# Patient Record
Sex: Female | Born: 1975
Health system: Southern US, Community
[De-identification: ages and names within clinical notes are randomized; demographics above are authoritative.]

## PROBLEM LIST (undated history)

## (undated) ENCOUNTER — Inpatient Hospital Stay: Payer: Self-pay

## (undated) DIAGNOSIS — Z8719 Personal history of other diseases of the digestive system: Secondary | ICD-10-CM

## (undated) DIAGNOSIS — D649 Anemia, unspecified: Secondary | ICD-10-CM

## (undated) DIAGNOSIS — O219 Vomiting of pregnancy, unspecified: Secondary | ICD-10-CM

## (undated) DIAGNOSIS — Z8619 Personal history of other infectious and parasitic diseases: Secondary | ICD-10-CM

## (undated) DIAGNOSIS — F99 Mental disorder, not otherwise specified: Secondary | ICD-10-CM

## (undated) DIAGNOSIS — C801 Malignant (primary) neoplasm, unspecified: Secondary | ICD-10-CM

## (undated) DIAGNOSIS — K219 Gastro-esophageal reflux disease without esophagitis: Secondary | ICD-10-CM

## (undated) HISTORY — PX: GALLBLADDER SURGERY: SHX652

## (undated) HISTORY — DX: Vomiting of pregnancy, unspecified: O21.9

## (undated) HISTORY — DX: Mental disorder, not otherwise specified: F99

## (undated) HISTORY — PX: HERNIA REPAIR: SHX51

## (undated) HISTORY — DX: Gastro-esophageal reflux disease without esophagitis: K21.9

## (undated) HISTORY — PX: OTHER SURGICAL HISTORY: SHX169

---

## 1999-01-02 ENCOUNTER — Emergency Department (HOSPITAL_COMMUNITY): Admission: EM | Admit: 1999-01-02 | Discharge: 1999-01-02 | Payer: Self-pay | Admitting: Ophthalmology

## 2005-09-18 ENCOUNTER — Observation Stay: Payer: Self-pay | Admitting: Obstetrics and Gynecology

## 2008-06-07 ENCOUNTER — Emergency Department (HOSPITAL_COMMUNITY): Admission: EM | Admit: 2008-06-07 | Discharge: 2008-06-07 | Payer: Self-pay | Admitting: Emergency Medicine

## 2008-06-14 ENCOUNTER — Emergency Department: Payer: Self-pay | Admitting: Emergency Medicine

## 2008-10-10 ENCOUNTER — Observation Stay: Payer: Self-pay | Admitting: Obstetrics and Gynecology

## 2008-11-30 ENCOUNTER — Observation Stay: Payer: Self-pay | Admitting: Obstetrics and Gynecology

## 2008-12-01 ENCOUNTER — Observation Stay: Payer: Self-pay

## 2008-12-09 ENCOUNTER — Observation Stay: Payer: Self-pay

## 2008-12-11 ENCOUNTER — Inpatient Hospital Stay (HOSPITAL_COMMUNITY): Admission: AD | Admit: 2008-12-11 | Discharge: 2008-12-13 | Payer: Self-pay | Admitting: Obstetrics and Gynecology

## 2008-12-11 ENCOUNTER — Encounter (INDEPENDENT_AMBULATORY_CARE_PROVIDER_SITE_OTHER): Payer: Self-pay | Admitting: Obstetrics and Gynecology

## 2009-12-14 ENCOUNTER — Emergency Department: Payer: Self-pay | Admitting: Emergency Medicine

## 2010-02-20 ENCOUNTER — Emergency Department: Payer: Self-pay | Admitting: Emergency Medicine

## 2010-05-09 ENCOUNTER — Emergency Department: Payer: Self-pay | Admitting: Emergency Medicine

## 2011-02-18 LAB — CBC
HCT: 25.8 % — ABNORMAL LOW (ref 36.0–46.0)
HCT: 30.3 % — ABNORMAL LOW (ref 36.0–46.0)
Hemoglobin: 8.1 g/dL — ABNORMAL LOW (ref 12.0–15.0)
Hemoglobin: 9.4 g/dL — ABNORMAL LOW (ref 12.0–15.0)
MCHC: 31.1 g/dL (ref 30.0–36.0)
MCHC: 31.4 g/dL (ref 30.0–36.0)
MCV: 63.6 fL — ABNORMAL LOW (ref 78.0–100.0)
MCV: 63.9 fL — ABNORMAL LOW (ref 78.0–100.0)
Platelets: 249 10*3/uL (ref 150–400)
Platelets: 300 10*3/uL (ref 150–400)
RBC: 4.04 MIL/uL (ref 3.87–5.11)
RBC: 4.77 MIL/uL (ref 3.87–5.11)
RDW: 15.2 % (ref 11.5–15.5)
RDW: 15.2 % (ref 11.5–15.5)
WBC: 12.7 10*3/uL — ABNORMAL HIGH (ref 4.0–10.5)
WBC: 7.4 10*3/uL (ref 4.0–10.5)

## 2011-02-18 LAB — RPR: RPR Ser Ql: NONREACTIVE
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
## 2011-06-02 ENCOUNTER — Emergency Department: Payer: Self-pay | Admitting: Emergency Medicine

## 2011-08-01 LAB — POCT I-STAT, CHEM 8
BUN: <3 — ABNORMAL LOW
Calcium, Ion: 1.28
Chloride: 105
Creatinine, Ser: 0.7
Glucose, Bld: 87
HCT: 34 — ABNORMAL LOW
Hemoglobin: 11.6 — ABNORMAL LOW
Potassium: 4.4
Sodium: 137
TCO2: 24

## 2011-08-01 LAB — POCT PREGNANCY, URINE
Preg Test, Ur: NEGATIVE
Preg Test, Ur: POSITIVE

## 2011-08-01 LAB — URINE CULTURE: Colony Count: >=100000

## 2011-08-01 LAB — URINALYSIS, ROUTINE W REFLEX MICROSCOPIC
Glucose, UA: NEGATIVE
Hgb urine dipstick: NEGATIVE
Ketones, ur: 15 — AB
Nitrite: NEGATIVE
Protein, ur: NEGATIVE
Specific Gravity, Urine: 1.029
Urobilinogen, UA: 1
pH: 6

## 2011-08-01 LAB — URINE MICROSCOPIC-ADD ON

## 2013-08-15 ENCOUNTER — Ambulatory Visit: Payer: Self-pay | Admitting: Family

## 2013-10-06 ENCOUNTER — Emergency Department: Payer: Self-pay | Admitting: Internal Medicine

## 2013-10-06 LAB — URINALYSIS, COMPLETE
Bilirubin,UR: NEGATIVE
Blood: NEGATIVE
Glucose,UR: NEGATIVE mg/dL (ref 0–75)
Ketone: NEGATIVE
Nitrite: POSITIVE
Ph: 6 (ref 4.5–8.0)
Protein: NEGATIVE
RBC,UR: <1 /HPF (ref 0–5)
Specific Gravity: 1.017 (ref 1.003–1.030)
Squamous Epithelial: <1
WBC UR: 6 /HPF (ref 0–5)

## 2013-10-08 LAB — URINE CULTURE

## 2013-11-29 ENCOUNTER — Inpatient Hospital Stay: Payer: Self-pay | Admitting: Internal Medicine

## 2013-11-29 LAB — COMPREHENSIVE METABOLIC PANEL
Albumin: 3.4 g/dL (ref 3.4–5.0)
Alkaline Phosphatase: 118 U/L — ABNORMAL HIGH
Anion Gap: 6 — ABNORMAL LOW (ref 7–16)
BUN: 7 mg/dL (ref 7–18)
Bilirubin,Total: 0.3 mg/dL (ref 0.2–1.0)
Calcium, Total: 8.9 mg/dL (ref 8.5–10.1)
Chloride: 108 mmol/L — ABNORMAL HIGH (ref 98–107)
Co2: 23 mmol/L (ref 21–32)
Creatinine: 0.64 mg/dL (ref 0.60–1.30)
EGFR (African American): >60
EGFR (Non-African Amer.): >60
Glucose: 85 mg/dL (ref 65–99)
Osmolality: 271 (ref 275–301)
Potassium: 3.5 mmol/L (ref 3.5–5.1)
SGOT(AST): 24 U/L (ref 15–37)
SGPT (ALT): 13 U/L (ref 12–78)
Sodium: 137 mmol/L (ref 136–145)
Total Protein: 7.9 g/dL (ref 6.4–8.2)

## 2013-11-29 LAB — DIFFERENTIAL
Bands: 1 %
Basophil: 1 %
Lymphocytes: 37 %
Monocytes: 6 %
Segmented Neutrophils: 54 %
Variant Lymphocyte - H1-Rlymph: 1 %

## 2013-11-29 LAB — CBC
HCT: 20.6 % — ABNORMAL LOW (ref 35.0–47.0)
HGB: 5.6 g/dL — ABNORMAL LOW (ref 12.0–16.0)
MCH: 13.5 pg — ABNORMAL LOW (ref 26.0–34.0)
MCHC: 27 g/dL — ABNORMAL LOW (ref 32.0–36.0)
MCV: 50 fL — ABNORMAL LOW (ref 80–100)
Platelet: 378 10*3/uL (ref 150–440)
RBC: 4.11 10*6/uL (ref 3.80–5.20)
RDW: 19.1 % — ABNORMAL HIGH (ref 11.5–14.5)
WBC: 4.8 10*3/uL (ref 3.6–11.0)

## 2013-11-29 LAB — URINALYSIS, COMPLETE
Bacteria: NONE SEEN
Bilirubin,UR: NEGATIVE
Blood: NEGATIVE
Glucose,UR: NEGATIVE mg/dL (ref 0–75)
Ketone: NEGATIVE
Nitrite: NEGATIVE
Ph: 6 (ref 4.5–8.0)
Protein: NEGATIVE
RBC,UR: 5 /HPF (ref 0–5)
Specific Gravity: 1.019 (ref 1.003–1.030)
Squamous Epithelial: 4
WBC UR: 98 /HPF (ref 0–5)

## 2013-11-29 LAB — RETICULOCYTES
Absolute Retic Count: 0.0535 10*6/uL (ref 0.019–0.186)
Reticulocyte: 1.34 % (ref 0.4–3.1)

## 2013-11-29 LAB — LACTATE DEHYDROGENASE: LDH: 245 U/L (ref 81–246)

## 2013-11-29 LAB — FERRITIN: Ferritin (ARMC): 1 ng/mL — ABNORMAL LOW (ref 8–388)

## 2013-11-29 LAB — TROPONIN I: Troponin-I: <0.02 ng/mL

## 2013-11-30 LAB — BASIC METABOLIC PANEL
Anion Gap: 6 — ABNORMAL LOW (ref 7–16)
BUN: 6 mg/dL — ABNORMAL LOW (ref 7–18)
Calcium, Total: 8.6 mg/dL (ref 8.5–10.1)
Chloride: 108 mmol/L — ABNORMAL HIGH (ref 98–107)
Co2: 24 mmol/L (ref 21–32)
Creatinine: 0.8 mg/dL (ref 0.60–1.30)
EGFR (African American): >60
EGFR (Non-African Amer.): >60
Glucose: 93 mg/dL (ref 65–99)
Osmolality: 273 (ref 275–301)
Potassium: 3.9 mmol/L (ref 3.5–5.1)
Sodium: 138 mmol/L (ref 136–145)

## 2013-11-30 LAB — CBC WITH DIFFERENTIAL/PLATELET
Bands: 1 %
Basophil: 1 %
Eosinophil: 1 %
HCT: 25 % — ABNORMAL LOW (ref 35.0–47.0)
HGB: 7.6 g/dL — ABNORMAL LOW (ref 12.0–16.0)
Lymphocytes: 31 %
MCH: 17.4 pg — ABNORMAL LOW (ref 26.0–34.0)
MCHC: 30.3 g/dL — ABNORMAL LOW (ref 32.0–36.0)
MCV: 57 fL — ABNORMAL LOW (ref 80–100)
Monocytes: 4 %
Platelet: 323 10*3/uL (ref 150–440)
RBC: 4.35 10*6/uL (ref 3.80–5.20)
RDW: 31.9 % — ABNORMAL HIGH (ref 11.5–14.5)
Segmented Neutrophils: 62 %
WBC: 4.8 10*3/uL (ref 3.6–11.0)

## 2013-11-30 LAB — IRON AND TIBC
Iron Bind.Cap.(Total): 533 ug/dL — ABNORMAL HIGH (ref 250–450)
Iron Saturation: 2 %
Iron: 9 ug/dL — ABNORMAL LOW (ref 50–170)
Unbound Iron-Bind.Cap.: 524 ug/dL

## 2013-12-01 LAB — BASIC METABOLIC PANEL
Anion Gap: 4 — ABNORMAL LOW (ref 7–16)
BUN: 5 mg/dL — ABNORMAL LOW (ref 7–18)
Calcium, Total: 8.7 mg/dL (ref 8.5–10.1)
Chloride: 108 mmol/L — ABNORMAL HIGH (ref 98–107)
Co2: 26 mmol/L (ref 21–32)
Creatinine: 0.87 mg/dL (ref 0.60–1.30)
EGFR (African American): >60
EGFR (Non-African Amer.): >60
Glucose: 87 mg/dL (ref 65–99)
Osmolality: 272 (ref 275–301)
Potassium: 4.1 mmol/L (ref 3.5–5.1)
Sodium: 138 mmol/L (ref 136–145)

## 2013-12-01 LAB — URINE CULTURE

## 2013-12-01 LAB — CBC WITH DIFFERENTIAL/PLATELET
Basophil: 1 %
Eosinophil: 2 %
HCT: 24.2 % — ABNORMAL LOW (ref 35.0–47.0)
HGB: 7.4 g/dL — ABNORMAL LOW (ref 12.0–16.0)
Lymphocytes: 42 %
MCH: 17.5 pg — ABNORMAL LOW (ref 26.0–34.0)
MCHC: 30.6 g/dL — ABNORMAL LOW (ref 32.0–36.0)
MCV: 57 fL — ABNORMAL LOW (ref 80–100)
Monocytes: 4 %
Myelocyte: 1 %
Platelet: 262 10*3/uL (ref 150–440)
RBC: 4.23 10*6/uL (ref 3.80–5.20)
RDW: 31.5 % — ABNORMAL HIGH (ref 11.5–14.5)
Segmented Neutrophils: 50 %
WBC: 5.2 10*3/uL (ref 3.6–11.0)

## 2013-12-02 LAB — CBC WITH DIFFERENTIAL/PLATELET
Basophil #: 0 10*3/uL (ref 0.0–0.1)
Basophil %: 0.9 %
Eosinophil #: 0.2 10*3/uL (ref 0.0–0.7)
Eosinophil %: 3.6 %
HCT: 25 % — ABNORMAL LOW (ref 35.0–47.0)
HGB: 7.6 g/dL — ABNORMAL LOW (ref 12.0–16.0)
Lymphocyte #: 1.7 10*3/uL (ref 1.0–3.6)
Lymphocyte %: 33.2 %
MCH: 17.4 pg — ABNORMAL LOW (ref 26.0–34.0)
MCHC: 30.5 g/dL — ABNORMAL LOW (ref 32.0–36.0)
MCV: 57 fL — ABNORMAL LOW (ref 80–100)
Monocyte #: 0.4 x10 3/mm (ref 0.2–0.9)
Monocyte %: 6.9 %
Neutrophil #: 2.9 10*3/uL (ref 1.4–6.5)
Neutrophil %: 55.4 %
Platelet: 255 10*3/uL (ref 150–440)
RBC: 4.39 10*6/uL (ref 3.80–5.20)
RDW: 32.8 % — ABNORMAL HIGH (ref 11.5–14.5)
WBC: 5.2 10*3/uL (ref 3.6–11.0)

## 2013-12-05 ENCOUNTER — Emergency Department: Payer: Self-pay | Admitting: Emergency Medicine

## 2013-12-05 LAB — COMPREHENSIVE METABOLIC PANEL
Albumin: 3 g/dL — ABNORMAL LOW (ref 3.4–5.0)
Alkaline Phosphatase: 112 U/L
Anion Gap: 1 — ABNORMAL LOW (ref 7–16)
BUN: 10 mg/dL (ref 7–18)
Bilirubin,Total: 0.2 mg/dL (ref 0.2–1.0)
Calcium, Total: 8.3 mg/dL — ABNORMAL LOW (ref 8.5–10.1)
Chloride: 112 mmol/L — ABNORMAL HIGH (ref 98–107)
Co2: 24 mmol/L (ref 21–32)
Creatinine: 0.67 mg/dL (ref 0.60–1.30)
EGFR (African American): >60
EGFR (Non-African Amer.): >60
Glucose: 65 mg/dL (ref 65–99)
Osmolality: 271 (ref 275–301)
Potassium: 3.8 mmol/L (ref 3.5–5.1)
SGOT(AST): 30 U/L (ref 15–37)
SGPT (ALT): 13 U/L (ref 12–78)
Sodium: 137 mmol/L (ref 136–145)
Total Protein: 7.4 g/dL (ref 6.4–8.2)

## 2013-12-05 LAB — CBC
HCT: 26.8 % — ABNORMAL LOW (ref 35.0–47.0)
HGB: 8.2 g/dL — ABNORMAL LOW (ref 12.0–16.0)
MCH: 18.1 pg — ABNORMAL LOW (ref 26.0–34.0)
MCHC: 30.7 g/dL — ABNORMAL LOW (ref 32.0–36.0)
MCV: 59 fL — ABNORMAL LOW (ref 80–100)
Platelet: 231 10*3/uL (ref 150–440)
RBC: 4.54 10*6/uL (ref 3.80–5.20)
RDW: 33.2 % — ABNORMAL HIGH (ref 11.5–14.5)
WBC: 5.1 10*3/uL (ref 3.6–11.0)

## 2013-12-05 LAB — TROPONIN I: Troponin-I: <0.02 ng/mL

## 2013-12-25 ENCOUNTER — Emergency Department: Payer: Self-pay | Admitting: Emergency Medicine

## 2013-12-25 LAB — COMPREHENSIVE METABOLIC PANEL
Albumin: 3.4 g/dL (ref 3.4–5.0)
Alkaline Phosphatase: 144 U/L — ABNORMAL HIGH
Anion Gap: 6 — ABNORMAL LOW (ref 7–16)
BUN: 10 mg/dL (ref 7–18)
Bilirubin,Total: 0.4 mg/dL (ref 0.2–1.0)
Calcium, Total: 8.7 mg/dL (ref 8.5–10.1)
Chloride: 108 mmol/L — ABNORMAL HIGH (ref 98–107)
Co2: 24 mmol/L (ref 21–32)
Creatinine: 0.77 mg/dL (ref 0.60–1.30)
EGFR (African American): >60
EGFR (Non-African Amer.): >60
Glucose: 70 mg/dL (ref 65–99)
Osmolality: 273 (ref 275–301)
Potassium: 3.6 mmol/L (ref 3.5–5.1)
SGOT(AST): 66 U/L — ABNORMAL HIGH (ref 15–37)
SGPT (ALT): 61 U/L (ref 12–78)
Sodium: 138 mmol/L (ref 136–145)
Total Protein: 8 g/dL (ref 6.4–8.2)

## 2013-12-25 LAB — CBC
HCT: 31.8 % — ABNORMAL LOW (ref 35.0–47.0)
HGB: 9.4 g/dL — ABNORMAL LOW (ref 12.0–16.0)
MCH: 17.8 pg — ABNORMAL LOW (ref 26.0–34.0)
MCHC: 29.6 g/dL — ABNORMAL LOW (ref 32.0–36.0)
MCV: 60 fL — ABNORMAL LOW (ref 80–100)
Platelet: 364 10*3/uL (ref 150–440)
RBC: 5.28 10*6/uL — ABNORMAL HIGH (ref 3.80–5.20)
RDW: 32 % — ABNORMAL HIGH (ref 11.5–14.5)
WBC: 5.7 10*3/uL (ref 3.6–11.0)

## 2013-12-25 LAB — URINALYSIS, COMPLETE
Bacteria: NONE SEEN
Bilirubin,UR: NEGATIVE
Blood: NEGATIVE
Glucose,UR: NEGATIVE mg/dL (ref 0–75)
Ketone: NEGATIVE
Leukocyte Esterase: NEGATIVE
Nitrite: NEGATIVE
Ph: 7 (ref 4.5–8.0)
Protein: NEGATIVE
RBC,UR: 1 /HPF (ref 0–5)
Specific Gravity: 1.02 (ref 1.003–1.030)
Squamous Epithelial: 5
WBC UR: 2 /HPF (ref 0–5)

## 2013-12-25 LAB — LIPASE, BLOOD: Lipase: 269 U/L (ref 73–393)

## 2014-01-05 ENCOUNTER — Ambulatory Visit: Payer: Self-pay | Admitting: Gastroenterology

## 2014-01-08 ENCOUNTER — Emergency Department: Payer: Self-pay | Admitting: Emergency Medicine

## 2014-01-12 ENCOUNTER — Ambulatory Visit: Payer: Self-pay | Admitting: Gastroenterology

## 2014-03-30 DIAGNOSIS — R1013 Epigastric pain: Secondary | ICD-10-CM | POA: Insufficient documentation

## 2014-03-30 DIAGNOSIS — D649 Anemia, unspecified: Secondary | ICD-10-CM | POA: Insufficient documentation

## 2014-04-05 ENCOUNTER — Ambulatory Visit: Payer: Self-pay | Admitting: Gastroenterology

## 2014-04-06 ENCOUNTER — Emergency Department: Payer: Self-pay | Admitting: Emergency Medicine

## 2014-04-06 LAB — CBC WITH DIFFERENTIAL/PLATELET
Basophil #: 0 10*3/uL (ref 0.0–0.1)
Basophil %: 0.8 %
Eosinophil #: 0.1 10*3/uL (ref 0.0–0.7)
Eosinophil %: 2.3 %
HCT: 25.3 % — ABNORMAL LOW (ref 35.0–47.0)
HGB: 7.5 g/dL — ABNORMAL LOW (ref 12.0–16.0)
Lymphocyte #: 1.9 10*3/uL (ref 1.0–3.6)
Lymphocyte %: 39.7 %
MCH: 16.2 pg — ABNORMAL LOW (ref 26.0–34.0)
MCHC: 29.7 g/dL — ABNORMAL LOW (ref 32.0–36.0)
MCV: 55 fL — ABNORMAL LOW (ref 80–100)
Monocyte #: 0.2 x10 3/mm (ref 0.2–0.9)
Monocyte %: 4.4 %
Neutrophil #: 2.5 10*3/uL (ref 1.4–6.5)
Neutrophil %: 52.8 %
Platelet: 418 10*3/uL (ref 150–440)
RBC: 4.64 10*6/uL (ref 3.80–5.20)
RDW: 16.1 % — ABNORMAL HIGH (ref 11.5–14.5)
WBC: 4.7 10*3/uL (ref 3.6–11.0)

## 2014-04-06 LAB — COMPREHENSIVE METABOLIC PANEL
Albumin: 3.5 g/dL (ref 3.4–5.0)
Alkaline Phosphatase: 125 U/L — ABNORMAL HIGH
Anion Gap: 7 (ref 7–16)
BUN: 9 mg/dL (ref 7–18)
Bilirubin,Total: 0.3 mg/dL (ref 0.2–1.0)
Calcium, Total: 8.9 mg/dL (ref 8.5–10.1)
Chloride: 108 mmol/L — ABNORMAL HIGH (ref 98–107)
Co2: 26 mmol/L (ref 21–32)
Creatinine: 0.82 mg/dL (ref 0.60–1.30)
EGFR (African American): >60
EGFR (Non-African Amer.): >60
Glucose: 86 mg/dL (ref 65–99)
Osmolality: 279 (ref 275–301)
Potassium: 3.3 mmol/L — ABNORMAL LOW (ref 3.5–5.1)
SGOT(AST): 27 U/L (ref 15–37)
SGPT (ALT): 11 U/L — ABNORMAL LOW (ref 12–78)
Sodium: 141 mmol/L (ref 136–145)
Total Protein: 8.3 g/dL — ABNORMAL HIGH (ref 6.4–8.2)

## 2014-04-06 LAB — TROPONIN I: Troponin-I: <0.02 ng/mL

## 2014-04-06 LAB — LIPASE, BLOOD: Lipase: 216 U/L (ref 73–393)

## 2014-04-07 LAB — URINALYSIS, COMPLETE
Bilirubin,UR: NEGATIVE
Glucose,UR: NEGATIVE mg/dL (ref 0–75)
Ketone: NEGATIVE
Nitrite: NEGATIVE
Ph: 5 (ref 4.5–8.0)
Protein: 30
RBC,UR: 4 /HPF (ref 0–5)
Specific Gravity: 1.027 (ref 1.003–1.030)
Squamous Epithelial: 12
WBC UR: 30 /HPF (ref 0–5)

## 2014-04-09 LAB — URINE CULTURE

## 2014-06-06 ENCOUNTER — Observation Stay: Payer: Self-pay | Admitting: Internal Medicine

## 2014-06-06 LAB — URINALYSIS, COMPLETE
Bilirubin,UR: NEGATIVE
Glucose,UR: NEGATIVE mg/dL (ref 0–75)
Nitrite: POSITIVE
Ph: 5 (ref 4.5–8.0)
Protein: 30
RBC,UR: 2 /HPF (ref 0–5)
Specific Gravity: 1.025 (ref 1.003–1.030)
Squamous Epithelial: 9
WBC UR: 32 /HPF (ref 0–5)

## 2014-06-06 LAB — CBC WITH DIFFERENTIAL/PLATELET
Eosinophil: 1 %
HCT: 25.6 % — ABNORMAL LOW (ref 35.0–47.0)
HGB: 7.2 g/dL — ABNORMAL LOW (ref 12.0–16.0)
Lymphocytes: 31 %
MCH: 14.4 pg — ABNORMAL LOW (ref 26.0–34.0)
MCHC: 28.2 g/dL — ABNORMAL LOW (ref 32.0–36.0)
MCV: 51 fL — ABNORMAL LOW (ref 80–100)
Monocytes: 5 %
Platelet: 405 10*3/uL (ref 150–440)
RBC: 4.99 10*6/uL (ref 3.80–5.20)
RDW: 19 % — ABNORMAL HIGH (ref 11.5–14.5)
Segmented Neutrophils: 63 %
WBC: 4.4 10*3/uL (ref 3.6–11.0)

## 2014-06-06 LAB — IRON AND TIBC
Iron Bind.Cap.(Total): 484 ug/dL — ABNORMAL HIGH (ref 250–450)
Iron Saturation: 3 %
Iron: 15 ug/dL — ABNORMAL LOW (ref 50–170)
Unbound Iron-Bind.Cap.: 469 ug/dL

## 2014-06-06 LAB — COMPREHENSIVE METABOLIC PANEL
Albumin: 3.5 g/dL (ref 3.4–5.0)
Alkaline Phosphatase: 120 U/L — ABNORMAL HIGH
Anion Gap: 9 (ref 7–16)
BUN: 6 mg/dL — ABNORMAL LOW (ref 7–18)
Bilirubin,Total: 0.5 mg/dL (ref 0.2–1.0)
Calcium, Total: 8.9 mg/dL (ref 8.5–10.1)
Chloride: 108 mmol/L — ABNORMAL HIGH (ref 98–107)
Co2: 24 mmol/L (ref 21–32)
Creatinine: 0.76 mg/dL (ref 0.60–1.30)
EGFR (African American): >60
EGFR (Non-African Amer.): >60
Glucose: 88 mg/dL (ref 65–99)
Osmolality: 278 (ref 275–301)
Potassium: 3.5 mmol/L (ref 3.5–5.1)
SGOT(AST): 29 U/L (ref 15–37)
SGPT (ALT): 14 U/L
Sodium: 141 mmol/L (ref 136–145)
Total Protein: 8.7 g/dL — ABNORMAL HIGH (ref 6.4–8.2)

## 2014-06-06 LAB — LIPASE, BLOOD: Lipase: 151 U/L (ref 73–393)

## 2014-06-06 LAB — HEMOGLOBIN
HGB: 6.1 g/dL — ABNORMAL LOW (ref 12.0–16.0)
HGB: 6.8 g/dL — ABNORMAL LOW (ref 12.0–16.0)

## 2014-06-06 LAB — TROPONIN I: Troponin-I: <0.02 ng/mL

## 2014-06-07 LAB — CBC WITH DIFFERENTIAL/PLATELET
Basophil #: 0 10*3/uL (ref 0.0–0.1)
Basophil %: 0.7 %
Eosinophil #: 0.1 10*3/uL (ref 0.0–0.7)
Eosinophil %: 1.9 %
HCT: 25.4 % — ABNORMAL LOW (ref 35.0–47.0)
HGB: 7.9 g/dL — ABNORMAL LOW (ref 12.0–16.0)
Lymphocyte #: 1.6 10*3/uL (ref 1.0–3.6)
Lymphocyte %: 30.1 %
MCH: 17.7 pg — ABNORMAL LOW (ref 26.0–34.0)
MCHC: 31.1 g/dL — ABNORMAL LOW (ref 32.0–36.0)
MCV: 57 fL — ABNORMAL LOW (ref 80–100)
Monocyte #: 0.4 x10 3/mm (ref 0.2–0.9)
Monocyte %: 7.6 %
Neutrophil #: 3.2 10*3/uL (ref 1.4–6.5)
Neutrophil %: 59.7 %
Platelet: 280 10*3/uL (ref 150–440)
RBC: 4.46 10*6/uL (ref 3.80–5.20)
RDW: 26.9 % — ABNORMAL HIGH (ref 11.5–14.5)
WBC: 5.4 10*3/uL (ref 3.6–11.0)

## 2014-06-07 LAB — BASIC METABOLIC PANEL
Anion Gap: 6 — ABNORMAL LOW (ref 7–16)
BUN: 4 mg/dL — ABNORMAL LOW (ref 7–18)
Calcium, Total: 7.8 mg/dL — ABNORMAL LOW (ref 8.5–10.1)
Chloride: 113 mmol/L — ABNORMAL HIGH (ref 98–107)
Co2: 23 mmol/L (ref 21–32)
Creatinine: 0.77 mg/dL (ref 0.60–1.30)
EGFR (African American): >60
EGFR (Non-African Amer.): >60
Glucose: 90 mg/dL (ref 65–99)
Osmolality: 280 (ref 275–301)
Potassium: 3.6 mmol/L (ref 3.5–5.1)
Sodium: 142 mmol/L (ref 136–145)

## 2014-06-08 LAB — URINE CULTURE

## 2014-08-10 ENCOUNTER — Ambulatory Visit: Payer: Self-pay

## 2014-08-10 LAB — IRON AND TIBC
Iron Bind.Cap.(Total): 505 ug/dL — ABNORMAL HIGH (ref 250–450)
Iron Saturation: 8 %
Iron: 41 ug/dL — ABNORMAL LOW (ref 50–170)
Unbound Iron-Bind.Cap.: 464 ug/dL

## 2014-08-10 LAB — COMPREHENSIVE METABOLIC PANEL
Albumin: 3.7 g/dL (ref 3.4–5.0)
Alkaline Phosphatase: 140 U/L — ABNORMAL HIGH
Anion Gap: 7 (ref 7–16)
BUN: 6 mg/dL — ABNORMAL LOW (ref 7–18)
Bilirubin,Total: 0.6 mg/dL (ref 0.2–1.0)
Calcium, Total: 9 mg/dL (ref 8.5–10.1)
Chloride: 105 mmol/L (ref 98–107)
Co2: 27 mmol/L (ref 21–32)
Creatinine: 0.88 mg/dL (ref 0.60–1.30)
EGFR (African American): >60
EGFR (Non-African Amer.): >60
Glucose: 79 mg/dL (ref 65–99)
Osmolality: 274 (ref 275–301)
Potassium: 3.7 mmol/L (ref 3.5–5.1)
SGOT(AST): 28 U/L (ref 15–37)
SGPT (ALT): 19 U/L
Sodium: 139 mmol/L (ref 136–145)
Total Protein: 8.8 g/dL — ABNORMAL HIGH (ref 6.4–8.2)

## 2014-08-10 LAB — CBC CANCER CENTER
Basophil #: 0.1 x10 3/mm (ref 0.0–0.1)
Basophil %: 0.8 %
Eosinophil #: 0.1 x10 3/mm (ref 0.0–0.7)
Eosinophil %: 1.1 %
HCT: 31 % — ABNORMAL LOW (ref 35.0–47.0)
HGB: 9 g/dL — ABNORMAL LOW (ref 12.0–16.0)
Lymphocyte #: 1.3 x10 3/mm (ref 1.0–3.6)
Lymphocyte %: 18.3 %
MCH: 17 pg — ABNORMAL LOW (ref 26.0–34.0)
MCHC: 29 g/dL — ABNORMAL LOW (ref 32.0–36.0)
MCV: 59 fL — ABNORMAL LOW (ref 80–100)
Monocyte #: 0.5 x10 3/mm (ref 0.2–0.9)
Monocyte %: 6.9 %
Neutrophil #: 5.1 x10 3/mm (ref 1.4–6.5)
Neutrophil %: 72.9 %
Platelet: 384 x10 3/mm (ref 150–440)
RBC: 5.31 10*6/uL — ABNORMAL HIGH (ref 3.80–5.20)
RDW: 22.9 % — ABNORMAL HIGH (ref 11.5–14.5)
WBC: 7 x10 3/mm (ref 3.6–11.0)

## 2014-08-10 LAB — FERRITIN: Ferritin (ARMC): 5 ng/mL — ABNORMAL LOW (ref 8–388)

## 2014-08-15 ENCOUNTER — Ambulatory Visit: Payer: Self-pay | Admitting: Gastroenterology

## 2014-08-18 LAB — IRON AND TIBC
Iron Bind.Cap.(Total): 350 ug/dL (ref 250–450)
Iron Saturation: 18 %
Iron: 63 ug/dL (ref 50–170)
Unbound Iron-Bind.Cap.: 287 ug/dL

## 2014-09-01 DIAGNOSIS — K449 Diaphragmatic hernia without obstruction or gangrene: Secondary | ICD-10-CM | POA: Insufficient documentation

## 2014-09-01 DIAGNOSIS — R0789 Other chest pain: Secondary | ICD-10-CM | POA: Insufficient documentation

## 2014-09-01 DIAGNOSIS — J45909 Unspecified asthma, uncomplicated: Secondary | ICD-10-CM | POA: Insufficient documentation

## 2014-09-01 DIAGNOSIS — F319 Bipolar disorder, unspecified: Secondary | ICD-10-CM | POA: Insufficient documentation

## 2014-09-01 DIAGNOSIS — K219 Gastro-esophageal reflux disease without esophagitis: Secondary | ICD-10-CM | POA: Insufficient documentation

## 2014-09-01 DIAGNOSIS — D509 Iron deficiency anemia, unspecified: Secondary | ICD-10-CM | POA: Insufficient documentation

## 2014-09-01 DIAGNOSIS — R519 Headache, unspecified: Secondary | ICD-10-CM | POA: Insufficient documentation

## 2014-09-03 ENCOUNTER — Ambulatory Visit: Payer: Self-pay | Admitting: Hematology and Oncology

## 2014-09-27 ENCOUNTER — Ambulatory Visit: Payer: Self-pay | Admitting: Obstetrics and Gynecology

## 2014-09-27 LAB — CBC
HCT: 37.5 % (ref 35.0–47.0)
HGB: 11.4 g/dL — ABNORMAL LOW (ref 12.0–16.0)
MCH: 19.5 pg — ABNORMAL LOW (ref 26.0–34.0)
MCHC: 30.5 g/dL — ABNORMAL LOW (ref 32.0–36.0)
MCV: 64 fL — ABNORMAL LOW (ref 80–100)
Platelet: 318 10*3/uL (ref 150–440)
RBC: 5.87 10*6/uL — ABNORMAL HIGH (ref 3.80–5.20)
RDW: 20.3 % — ABNORMAL HIGH (ref 11.5–14.5)
WBC: 4 10*3/uL (ref 3.6–11.0)

## 2014-10-02 ENCOUNTER — Ambulatory Visit: Payer: Self-pay | Admitting: Obstetrics and Gynecology

## 2014-11-03 NOTE — L&D Delivery Note (Signed)
Delivery Note At 7:31 AM a viable preterm female infant was delivered via Vaginal, Spontaneous Delivery (Presentation: Right Occiput Anterior).  APGAR: 8/9 weight   pending  Placenta status: Intact, Spontaneous.  Cord:  with the following complications: terminal bradycardia    CTSP as fetal head was on perineum and baby's heart rate was in the 90s. Baby was born after 1-2 pushes over an intact perineum. Cry with tactile stimulation. Cord was clamped and cut after a 1-2 minute delay while baby lie on mother's chest/abdomen.  Anesthesia: None  Episiotomy: None Lacerations: None Suture Repair: NA Est. Blood Loss (mL): 350  Mom to postpartum.  Baby to warmer.  Zoe Ramos 09/29/2015, 7:50 AM

## 2014-11-15 DIAGNOSIS — Z931 Gastrostomy status: Secondary | ICD-10-CM | POA: Insufficient documentation

## 2014-12-06 ENCOUNTER — Emergency Department: Payer: Self-pay | Admitting: Emergency Medicine

## 2014-12-06 LAB — BASIC METABOLIC PANEL
Anion Gap: 6 — ABNORMAL LOW (ref 7–16)
BUN: 5 mg/dL — ABNORMAL LOW (ref 7–18)
Calcium, Total: 8.8 mg/dL (ref 8.5–10.1)
Chloride: 106 mmol/L (ref 98–107)
Co2: 27 mmol/L (ref 21–32)
Creatinine: 0.93 mg/dL (ref 0.60–1.30)
EGFR (African American): >60
EGFR (Non-African Amer.): >60
Glucose: 86 mg/dL (ref 65–99)
Osmolality: 274 (ref 275–301)
Potassium: 3.5 mmol/L (ref 3.5–5.1)
Sodium: 139 mmol/L (ref 136–145)

## 2014-12-06 LAB — CBC WITH DIFFERENTIAL/PLATELET
Basophil #: 0.1 10*3/uL (ref 0.0–0.1)
Basophil %: 0.7 %
Eosinophil #: 0.2 10*3/uL (ref 0.0–0.7)
Eosinophil %: 2.1 %
HCT: 35.7 % (ref 35.0–47.0)
HGB: 11.3 g/dL — ABNORMAL LOW (ref 12.0–16.0)
Lymphocyte #: 1.9 10*3/uL (ref 1.0–3.6)
Lymphocyte %: 20.5 %
MCH: 19.8 pg — ABNORMAL LOW (ref 26.0–34.0)
MCHC: 31.7 g/dL — ABNORMAL LOW (ref 32.0–36.0)
MCV: 63 fL — ABNORMAL LOW (ref 80–100)
Monocyte #: 0.9 x10 3/mm (ref 0.2–0.9)
Monocyte %: 9.8 %
Neutrophil #: 6.1 10*3/uL (ref 1.4–6.5)
Neutrophil %: 66.9 %
Platelet: 420 10*3/uL (ref 150–440)
RBC: 5.72 10*6/uL — ABNORMAL HIGH (ref 3.80–5.20)
RDW: 14.8 % — ABNORMAL HIGH (ref 11.5–14.5)
WBC: 9.2 10*3/uL (ref 3.6–11.0)

## 2014-12-21 ENCOUNTER — Ambulatory Visit: Payer: Self-pay | Admitting: Hematology and Oncology

## 2015-02-24 NOTE — Discharge Summary (Signed)
PATIENT NAME:  Zoe Ramos, SPOONEMORE MR#:  [id number] DATE OF BIRTH:  1976/03/01  DATE OF ADMISSION:  11/29/2013 DATE OF DISCHARGE:  12/02/2013  ADDENDUM to previously dictated discharge summary.    Please note, the patient left AGAINST MEDICAL ADVICE and I strongly recommend she follows up with her primary care physician for further workup for her hiatal hernia and anemia.   ____________________________ Bryce Cheever S. Manuella Ghazi, MD vss:jm D: 12/03/2013 11:09:00 ET T: 12/03/2013 13:55:22 ET JOB#: [id number]  cc: Cletis Athens, MD Lollie Sails, MD Rodena Goldmann III, MD Brylynn Hanssen S. Manuella Ghazi, MD, <Dictator>    Lucina Mellow [facility] MD ELECTRONICALLY SIGNED 12/05/2013 13:11

## 2015-02-24 NOTE — Discharge Summary (Signed)
PATIENT NAME:  Zoe Ramos, Zoe Ramos MR#:  [id number] DATE OF BIRTH:  [birth year]  DATE OF ADMISSION:  06/06/2014 DATE OF DISCHARGE:  06/07/2014  DISCHARGE DIAGNOSES:  1.  Iron-deficiency anemia.  2.  Hiatal hernia.  3.  Chronic gastrointestinal blood loss.  4.  Tobacco abuse.   DISCHARGE MEDICATIONS:  1.  Depo-Provera 1 mg intramuscular every 3 months.  2.  Haldol 1 mL intramuscular once a month.  3.  Linzess 1 tablet oral once a day.  4.  Ferrous sulfate 325 mg oral 2 times a day with meals.  5.  Ciprofloxacin 5 mg oral 2 times a day for 3 days. 6.  Protonix 40 mg oral 2 times a day.   IMAGING STUDIES: Include abdominal x-ray which showed nothing acute, had a large hiatal hernia.   ADMITTING HISTORY AND PHYSICAL: Please see detailed H and P dictated previously. In brief, a 39 year old female patient was admitted to the hospitalist service after she presented with some nausea, lower abdominal pain, had UTI but also had incidental finding of significant anemia. The patient was transfused 2 units of blood. She has had prior EGD and colonoscopies, had large hiatal hernia, was supposed to follow up with surgery for surgery, but she was noncompliant with the followups. Now that she does not have any further bleeding, the patient's symptoms have resolved, her hemoglobin is stable, the patient will be discharged back home to follow up with Dr. Pat Patrick of surgery for her hiatal hernia. The patient's iron has been increased from once a day to twice a day. For her UTI, she was treated with IV antibiotics in the hospital and will be on ciprofloxacin for 3 more days after discharge.   PHYSICAL EXAMINATION: Prior to discharge: LUNGS: Sound clear.  HEART: Sounds are S1, S2.  ABDOMEN: Soft, nontender. GENITOURINARY: No further bleeding noticed.   DISCHARGE INSTRUCTIONS: Regular diet. Activity as tolerated. Follow up with Dr. Pat Patrick and primary care physician in a week. Return to the Emergency Room if there is any  blood or worsening symptoms.    ____________________________ Leia Alf Zakya Halabi, MD srs:tm D: 06/08/2014 12:51:32 ET T: 06/08/2014 15:25:05 ET JOB#: [id number]  cc: Alveta Heimlich R. Darvin Neighbours, MD, <Dictator> Rodena Goldmann III, MD  Neita Carp MD ELECTRONICALLY SIGNED 06/19/2014 14:10

## 2015-02-24 NOTE — H&P (Signed)
PATIENT NAME:  Zoe Ramos, MASK MR#:  [id number] DATE OF BIRTH:  Oct 17, 1976  DATE OF ADMISSION:  10/02/2014  PREOPERATIVE DIAGNOSES: 1.  Menorrhagia to anemia.  2.  Status post blood transfusion 06/05/2014.  3.  Chronic pelvic pain/severe dysmenorrhea.  HISTORY OF PRESENT ILLNESS: The patient is a 39 year old African American female, para 4-1-1-5, on Depo-Provera for contraception and cycle regulation, with long history of dysmenorrhea and perimenstrual low backache, with significant uncontrolled abnormal uterine bleeding that prompted a transfusion on 06/05/2014, presents for surgical evaluation. The patient had preoperative ultrasound which was normal. No uterine pathology was identified.   PAST MEDICAL HISTORY:  1.  Tobacco abuse.  2.  Abnormal uterine bleeding.  3.  Severe dysmenorrhea.  4.  Perimenstrual low back pain.  5.  Bipolar disorder, depressed, mild. 6.  GERD.  7.  Asthma.  8.  Headache.  9.  Anemia.   PAST SURGICAL HISTORY: 1.  Laparoscopic cholecystectomy.  2.  Hernia repair in 1997.   PAST OB HISTORY: Para 4-1-0-5, all spontaneous vaginal deliveries, with largest baby 7 pounds 3 ounces.   FAMILY HISTORY: Mother had breast cancer. No history of colon or ovary cancer. Diabetes mellitus notable in father and sister.   SOCIAL HISTORY: The patient does not drink alcohol. She does smoke 1 pack of cigarettes a day.   DRUG ALLERGIES: None.   CURRENT MEDICATIONS: Haldol, Depo-Provera.  REVIEW OF SYSTEMS: The patient denies any respiratory symptoms. She does have chronic pelvic pain and abnormal uterine bleeding. Bowel and bladder function are normal.   PHYSICAL EXAMINATION: VITAL SIGNS: Weight 193.5 pounds, height 67.6 inches, body mass index 29.77. Blood pressure 107/74, heart rate 83. GENERAL: The patient is a pleasant African American female in no acute distress. She is alert and oriented.  SKIN: Without rash. OROPHARYNX: Clear.  NECK: Supple without thyromegaly or  adenopathy.  LUNGS: Clear.  HEART: Regular rate and rhythm without murmur.  ABDOMEN: Soft and nontender. No organomegaly. Small umbilical hernia is noted. There is a well-healed 10 cm transverse incision in the upper abdomen.  PELVIC: External genitalia normal. BUS normal. Vagina with good estrogen effect. Cervix without lesions. The uterus is midplane, normal and of appropriate size and shape. It is tender 1/4. There are no adnexal masses.  EXTREMITIES: Without clubbing, cyanosis, or edema.  MUSCULOSKELETAL: Normal.   IMPRESSION: 1.  Chronic pelvic pain/dysmenorrhea.  2.  History of abnormal uterine bleeding with need for transfusion.  3.  Normal pelvic ultrasound.   PLAN:  1.  Laparoscopy with peritoneal biopsies.  2.  Hysteroscopy with dilation and curettage.   PREOPERATIVE CONSENT: The patient is to undergo laparoscopy with peritoneal biopsies for evaluation of chronic pelvic pain and severe dysmenorrhea. She also is to undergo hysteroscopy with dilation and curettage because of her history of abnormal uterine bleeding and anemia, which has required transfusion in the past. The patient is understanding of the planned procedures and is aware of and is accepting of all surgical risks, which include but are not limited to bleeding, infection, pelvic organ injury with need for repair, uterine perforation, anesthesia risks, etc. All questions are answered. Informed consent is given. The patient is ready and willing to proceed with surgery as scheduled.   ____________________________ Alanda Slim Bodi Palmeri, MD mad:sb D: 10/02/2014 07:54:00 ET T: 10/02/2014 08:19:44 ET JOB#: [id number]  cc: Hassell Done A. Debany Vantol, MD, <Dictator>

## 2015-02-24 NOTE — Consult Note (Signed)
PATIENT NAME:  Zoe Ramos, Zoe Ramos MR#:  [id number] DATE OF BIRTH:  1976/10/16  DATE OF CONSULTATION:  11/30/2013  CONSULTING PHYSICIAN:  Lollie Sails, MD  REASON FOR CONSULTATION: Anemia.   HISTORY OF PRESENT ILLNESS: [name] is a 39 year old African American female who was admitted to the hospital yesterday. She states that she had seen her primary physician, Dr. Lavera Guise, several days ago, had some blood drawn at that time and was called that she had a very low hemoglobin and was told to go to the Emergency Room. She has been having some problems with feeling dizzy and weak. She states that she does have a history of anemia and has been anemic in the past, although she is uncertain of the etiology. She has had no luminal evaluations or other GI evaluation. She has not had a period for about a year due to being on Depo. She states she has been having some nausea and dizziness as well as headache, but no abdominal pain and no emesis. She states that she will rarely have emesis; that depends on what she eats. She has been having some problems with constipation, a bowel movement about every 3 to 4 days; however, this is not a change for her. She has not seen any black stools. She denies any previous history of peptic ulcer disease. She states she does take Tylenol and denies the use of any aspirins or NSAIDs.   GASTROINTESTINAL FAMILY HISTORY: Negative for colorectal cancer, liver disease, or ulcers.   PAST MEDICAL HISTORY: She has a history of schizophrenia/bipolar/depression. She has a history of anemia, uncertain etiology. She has a history of cholecystectomy as well as an umbilical herniorrhaphy.   ALLERGIES: No known drug allergies.   OUTPATIENT MEDICATIONS: Include Depo-Provera. She takes Haldol injectable 5 mg once a month. She also takes Prilosec 20 mg once a day. I am uncertain as to how long she has been taking Prilosec.   SOCIAL HISTORY: She does not use alcohol. She does not use illicit  drugs. She does, however, smoke cigarettes, about a pack of cigarettes daily.   REVIEW OF SYSTEMS: The patient has been eating ice for at least a year. She has not been eating cornstarch.   PHYSICAL EXAMINATION: VITAL SIGNS: Temperature 98.5, pulse 71, respirations 18, blood pressure 100/66.  GENERAL: She is a well-appearing 39 year old African American female in no acute distress.  HEENT: Normocephalic, atraumatic. Eyes are anicteric. Nose: Septum midline. No lesions. Oropharynx: No lesions.  NECK: Supple. No JVD. No lymphadenopathy. No thyromegaly.  HEART: Regular rate and rhythm without rub or gallop.  LUNGS: Bilaterally clear to auscultation.  ABDOMEN: Soft, nontender, nondistended. Bowel sounds positive, normoactive.  RECTAL: Anorectal examination is Hemoccult-negative. Of note, she was also Hemoccult-negative on admission in the ER.  EXTREMITIES: No clubbing, cyanosis, or edema.  NEUROLOGICAL: Cranial nerves II through XII grossly intact. Muscle strength bilaterally equal and symmetric, 5/5. DTRs bilaterally equal and symmetric.   LABORATORY AND DIAGNOSTIC DATA: Include the following: On admission to the hospital, she had an iron of 9. BUN 7, creatinine 0.64, sodium 137, potassium 3.5, chloride 108, bicarbonate 23. TIBC elevated at 533, UIBC normal at 524, iron saturation 2, ferritin 1. Hepatic profile was normal, with the exception of a slightly elevated alkaline phosphatase at 118. Troponin I x 1 was normal. Hemogram on admission to the hospital showed a white count of 4.8, hemoglobin and hematocrit 5.6 and 20.6, platelet count of 378, MCV 50. She has been transfused 1 unit  of blood since admission. Repeat hemogram early this morning showed a hemoglobin of 7.6. Of note also, she has a normal BUN on check and recheck. She had an ECG that was normal sinus rhythm. She had a urinalysis that was negative for blood, actually 5 per high-power field red blood cells.   ASSESSMENT:  1.   Anemia/severely microcytic/iron deficiency. This could be a multifactorial presentation. Agree with GI evaluation, although the patient is currently heme-negative and denies any GI symptoms or black stools. Will proceed with EGD tomorrow and once the stomach is cleared to know that there are no atypical lesions, we will proceed with colonoscopy the following day following bowel prep.  2.  Would further recommend hematology evaluation. I have ordered a hemoglobin electrophoresis today. She has no family history of sickle cell anemia.   I have discussed the risks, benefits, and complications of an EGD and colonoscopy to include, but not limited to bleeding, infection, perforation, and the risk of sedation, and she wishes to proceed. Further recommendations to follow. Continue transfusion as you are. Continue single daily dose PPI as you are.   ____________________________ Lollie Sails, MD mus:jcm D: 11/30/2013 19:14:41 ET T: 11/30/2013 19:28:54 ET JOB#: [id number]  cc: Lollie Sails, MD, <Dictator> Cletis Athens, MD Lollie Sails MD ELECTRONICALLY SIGNED 12/13/2013 14:35

## 2015-02-24 NOTE — H&P (Signed)
PATIENT NAME:  Zoe Ramos, LIGUORI MR#:  [id number] DATE OF BIRTH:  September 28, 1976  DATE OF ADMISSION:  06/06/2014  DATE OF BIRTH: April 19, 1976.   REFERRING PHYSICIAN: Dr. Jasmine December.   PRIMARY CARE PHYSICIAN: None.   CHIEF COMPLAINT: Vomiting and nausea.   HISTORY OF PRESENTING ILLNESS: A 39 year old female with history of chronic anemia and bipolar disorder, for the last 2 to 3 days had complaint of vomiting, pain in the abdomen and chest. The pain was 10 out of 10 on and off. Also had some headache and she had been vomiting frequently. She did not notice any blood in her vomitus and did not have any other complaint of shortness of breath, fever, chills or any urinary symptoms. In the ER, she was given some IV fluids because of looking dehydrated clinically and given nausea medications for symptomatic management. As her nausea and vomiting resolved as well as her abdominal pain also became much better, abdominal  x-ray was done which showed large hiatal hernia. No focal abnormalities and so she was given to hospitalist team for further management. After  IV fluids day, check her hemoglobin level which was reported to be 6.2 with dilution, so we decided to give her some blood transfusion and given to hospitalist team. When I reviewed the records later on because this patient was in the Emergency Room during the down time when computer system was not working. Later on when the system restarted, found to have urinary tract infection, though the patient was denying symptoms. Maybe that was the cause of her presentation and she developed anemia due to dilution.   REVIEW OF SYSTEMS: CONSTITUTIONAL: Negative for fever, fatigue, weakness, pain or weight loss. EYES: No blurry or double vision, discharge or redness.  EARS, NOSE, THROAT: No tinnitus, ear pain or hearing loss.  RESPIRATORY: No cough, wheezing, hemoptysis, or shortness of breath.  CARDIOVASCULAR: No chest pain, orthopnea, edema, arrhythmia, palpitations.   GASTROINTESTINAL: The patient had nausea, vomiting, and abdominal pain. No diarrhea. No blood in the stool or vomitus.  GENITOURINARY: No dysuria, hematuria, or increased frequency.  ENDOCRINE: No heat or cold intolerance. No excessive sweating.  SKIN: No acne, rashes, or lesions.  MUSCULOSKELETAL: No pain or swelling in the joints.  NEUROLOGICAL: No numbness, weakness, tremor.  PSYCHIATRIC: No anxiety, insomnia, bipolar disorder.   PAST MEDICAL HISTORY: Schizophrenia and bipolar disorder.   PAST SURGICAL HISTORY: Hernia repair surgery for umbilical hernia and gallbladder surgery and patient had large hiatal hernia.   SOCIAL HISTORY: She is a smoker with 1 pack of cigarettes daily. No alcohol. No illegal drug use.   FAMILY HISTORY: Her mother had breast cancer. Grandfather had pancreatic cancer. Father had prostate cancer and diabetes and sister has diabetes.   HOME MEDICATIONS:  1. Haldol 1 mL intramuscularly once a month.  2. Ferrous sulfate 325 mg oral once a day.  3. Depo-Provera 1 mL intramuscular every 3 months.   PHYSICAL EXAMINATION:  VITAL SIGNS: Temperature 98.1, pulse 67, respirations 18,  blood pressure 112/73 and pulse oximetry is 100% on room air.  GENERAL: The patient is fully alert, oriented to time, place, and person. Does not appear in any acute distress.  HEAD AND NECK: Head and neck atraumatic. Conjunctivae pink. Oral mucosa moist. Neck supple. No JVD.  RESPIRATORY: Bilateral equal and clear air entry.  CARDIOVASCULAR: S1, S2 present, regular. No murmur.  ABDOMEN: Soft, nontender. Bowel sounds present. No organomegaly.  SKIN: No rashes.  LEGS: No edema.  NEUROLOGICAL: Power 5/5 follows commands.  No gross abnormality.  JOINTS: No swelling or tenderness.  PSYCHIATRIC: Does not appear in any acute psychiatric illness at this time.   IMPORTANT LABORATORY RESULTS: Glucose 88, creatinine 0.76, sodium 141, potassium 3.5, chloride 108, and CO2 24, calcium 8.9.  Total  protein 8.7, bilirubin 0.5, alkaline phosphate 120, SGOT 29, SGPT 14.   Troponin less than 0.02.   WBC 4.4, hemoglobin 7.2, which dropped to 6.1, platelet count is 405,000, and MCV is 51. Urinalysis is positive 32 WBC and 1+ leukocyte esterase with positive nitrite. Abdomen three-way showed large hiatal hernia. No other focal abnormalities noted.   ASSESSMENT AND PLAN: A 39 year old female came with complaint of nausea and vomiting for the last 2 to 3 days with some abdominal epigastric pain and found having hiatal hernia and the complaint resolved after symptomatic management but also had dehydration and given IV fluid, which resulted in dilutional anemia and hemoglobin drop from 7 to 6 and  requiring blood transfusions.   1. Anemia, most likely it is anemia of iron deficiency. We will give blood transfusion and check iron studies. Her MCV is significantly low. If there is any questions on the laboratory results, then she might need to follow with hematologist in the office for this issue as she said that she had anemia in the past and required blood transfusion also. Denies any heavy menstruation as she is on IM contraceptives. Denies any blood loss or any injuries. We will get stool guaiac studies also.  2. Urinary tract infection. Will give Rocephin IV and send urine cultures.  3. Nausea and vomiting. Most likely this was due to urinary tract infection with a symptomatic management with Zofran it resolved.  4. Large hiatal hernia. This is known to patient and this will be managed by her primary care physician.  5. Smoking. Smoking cessation counseling is done for 4 minutes and offered her a nicotine patch, but she does not want it and she will be fine without it.   TOTAL TIME SPENT ON THIS ADMISSION: 50 minutes.    ____________________________ Ceasar Lund Anselm Jungling, MD vgv:jh D: 06/06/2014 23:03:00 ET T: 06/06/2014 23:34:25 ET JOB#: [id number]  cc: Ceasar Lund. Anselm Jungling, MD,  <Dictator> Vaughan Basta MD ELECTRONICALLY SIGNED 06/12/2014 8:17

## 2015-02-24 NOTE — Discharge Summary (Signed)
PATIENT NAME:  Zoe Ramos, Zoe Ramos MR#:  [id number] DATE OF BIRTH:  [birth year]  DATE OF ADMISSION:  11/29/2013 DATE OF DISCHARGE:  12/02/2013  DISCHARGE DIAGNOSES:   1. Severe anemia likely due to iron deficiency, status post 2 units of packed red blood cell transfusion with appropriate response with EGD showing large hiatal hernia and CT confirming same.   2.  Urinary tract infection.  3.  Hypokalemia, presently resolved. 4.  Tobacco abuse, counseled.    SECONDARY DIAGNOSES:   1.  Schizophrenia.   CONSULTATION:   1.  GI, Dr. Donnella Sham.  2.  Surgery, Dr. Pat Patrick.  PROCEDURE AND RADIOLOGY:  EGD by Dr. Donnella Sham on the 29th of January showed normal esophagus.  Large hiatal hernia.  CT scan of the chest, abdomen and pelvis with contrast on the 29th of January showed a large hiatal hernia with approximately 50% of the stomach located in the chest.  Scattered tiny pulmonary nodules.  UA on admission showed 98 WBCs, 2+ leukocyte esterase, no bacteria.  Urine culture was contaminated.    Vitamin B12 level was within normal limits.    HISTORY AND SHORT HOSPITAL COURSE:  The patient is a 39 year old female with above mentioned medical problems was admitted with severe anemia with last hemoglobin of 5.6 and recieved 2 PRBC transfusion which increased it to 7.4 to 7.6. Please see Dr. Marshia Ly history and physical for further details.    GI consultation was obtained with Dr. Donnella Sham who recommended EGD and was performed showing large hiatal hernia for which surgical consultation was recommended which was obtained with Dr. Pat Patrick who recommended furthur workup and likely outpatient surgery if need but patient didn't stay for furthur work up and left AMA .    Please note, the patient left AGAINST MEDICAL ADVICE and I strongly recommend she follows up with her primary care physician for further workup for her hiatal hernia and anemia.   ____________________________ Stanely Sexson S. Manuella Ghazi, MD vss:dp D: 12/03/2013  11:08:00 ET T: 12/03/2013 14:03:12 ET JOB#: [id number]  cc: Gitty Osterlund S. Manuella Ghazi, MD, <Dictator> cc: Cletis Athens, MD Lollie Sails, MD Rodena Goldmann III, MD Deniesha Stenglein S. Manuella Ghazi, MD, <Dictator> Lucina Mellow [facility] MD ELECTRONICALLY SIGNED 12/05/2013 13:11

## 2015-02-24 NOTE — Consult Note (Signed)
PATIENT NAME:  Zoe Ramos, Zoe Ramos MR#:  [id number] DATE OF BIRTH:  1976/05/12  DATE OF CONSULTATION:  12/02/2013  CONSULTING PHYSICIAN:  Rodena Goldmann III, MD PRIMARY CARE PHYSICIAN: Dr. Lavera Guise.  ADMITTING PHYSICIAN: [facility].   CHIEF COMPLAINT: Dizziness, lightheadedness, profound anemia.   HISTORY OF PRESENT ILLNESS: Zoe Ramos is a 39 year old woman with multiple medical problems and multiple psychiatric problems. Zoe Ramos was admitted to the hospital with severe anemia, hemoglobin of 5.6. Zoe Ramos has been having problems with anemia for many years  but has never had such a severe problem as identified currently.   Zoe Ramos has a history of a previous umbilical hernia repair, previous cholecystectomy. Zoe Ramos has a  psychiatric history of schizophrenia and depression. Zoe Ramos is followed by Dr. Cletis Athens.   ALLERGIES: Zoe Ramos has no medical drug allergies.   MEDICATIONS: Depo-Provera injections, Haldol, and Prilosec.   SOCIAL HISTORY: Zoe Ramos is a cigarette smoker and drinks alcohol occasionally.   REVIEW OF SYSTEMS: Otherwise unremarkable. Zoe Ramos does not have any colon cancer history.   FAMILY HISTORY: Noncontributory.   Zoe Ramos was transfused 2 units of blood and underwent an upper GI evaluation with endoscopy. The procedure revealed what appeared to be a large hiatal hernia with possible intrathoracic stomach.   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   Contrast did appear to flow through without difficulty. No bleeding site could be identified on CT or on endoscopy. The surgical service was consulted.   PHYSICAL EXAMINATION:  GENERAL: An alert, pleasant woman, cooperative in no significant distress.  VITAL SIGNS: Her blood pressure is 120/70, heart rate 80 and regular. Zoe Ramos is afebrile.  HEENT: No scleral icterus. No pupillary abnormalities. No facial deformities.  NECK: Supple, nontender with midline trachea. No adenopathy.  CHEST: Clear with no  adventitious sounds. Zoe Ramos has normal pulmonary excursion.  CARDIAC: No murmurs or gallops to my ear and Zoe Ramos seems to be in normal sinus rhythm.  ABDOMEN: Benign. No significant abdominal tenderness, no rebound, no guarding. No hernias noted.  EXTREMITIES: Full range of motion. No deformities.  PSYCHIATRIC: Normal orientation and reasonable affect.   I independently reviewed the CT scan. 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 There  does not appear to be evidence of torsion or volvulus. I would recommend an upper GI series to demonstrate the anatomy should surgery be a possible consideration.   Zoe Ramos does not appear particularly interested in surgery at the present time. I talked with her about surgical choices and Zoe Ramos was hesitant to consider that option at the present time, saying that Zoe Ramos really does not have any symptoms other than the anemia.   I would interested in seeing what a colonoscopy demonstrated to be certain it was not some other source of her ongoing blood loss since the site of blood loss has not been definitively identified.   We will follow her in the hospital if necessary and would recommend followup as an outpatient.   Zoe Ramos does not have any urgent surgical indications at this time.   ____________________________ Rodena Goldmann III, MD rle:np D: 12/02/2013 17:41:37 ET T: 12/02/2013 20:03:26 ET JOB#: [id number]  cc: Rodena Goldmann III, MD, <Dictator> Cletis Athens, MD Rodena Goldmann MD ELECTRONICALLY SIGNED 12/12/2013 22:53

## 2015-02-24 NOTE — Op Note (Signed)
PATIENT NAME:  Zoe Ramos, Zoe Ramos MR#:  [id number] DATE OF BIRTH:  02-03-1976  DATE OF PROCEDURE:  10/02/2014  PREOPERATIVE DIAGNOSES: 1.  Menorrhagia to anemia.  2.  Chronic pelvic pain with severe dysmenorrhea.   POSTOPERATIVE DIAGNOSES:  1.  Menorrhagia to anemia.  2.  Chronic pelvic pain with severe dysmenorrhea.   OPERATIVE PROCEDURES: 1.  Laparoscopy with peritoneal biopsies.  2.  Hysteroscopy with dilation and curettage.   SURGEON: Sharon Seller, M.D.   FIRST ASSISTANT: None.   ANESTHESIA: General endotracheal.   INDICATIONS: The patient is a 39 year old African American female, para 4-1-1-5, on Depo-Provera for contraception and cycle regulation, with long history of dysmenorrhea and perimenstrual low backache, with uncontrolled bleeding that prompted transfusion in August 2015, presents for surgical evaluation.   FINDINGS AT SURGERY: On laparoscopy, the patient had a grossly normal pelvis. No obvious stigmata of endometriosis was identified. There were adhesions between the omentum and the anterior abdominal wall in the upper abdomen. On hysteroscopy, the uterine cavity appeared normal with a thin endometrium being present.   DESCRIPTION OF PROCEDURE: The patient was brought to the operating room where she was placed in the supine position. General endotracheal anesthesia was induced without difficulty. She was placed in the dorsal lithotomy position using the bubble bee stirrups. A ChloraPrep and Betadine abdominal and perineal prep and drape was performed in standard fashion. A red Robinson catheter was used to drain 25 mL of clear urine from the bladder. A Hulka tenaculum was placed onto the cervix to facilitate uterine manipulation. The bony pelvis was noted to be gynecoid and there was good uterine descensus with tenaculum traction. A subumbilical vertical incision was made, 5 mm in length. The Optiview laparoscopic trocar system was used to place a 5 mm port directly into the  abdominopelvic cavity without evidence of bowel or vascular injury. A second 5 mm port was placed in the suprapubic region, in the midline, under direct visualization. The above-noted findings were photo documented. Random biopsies were taken of the cul-de-sac, left uterosacral ligament, left ovarian fossa, and right ovarian fossa, respectively. Good hemostasis was noted. Photo documentation of the pelvic and abdominal findings was completed. Upon completion of this part of the procedure, the laparoscopy was terminated. The pneumoperitoneum was released. The incisions were closed with 4-0 Vicryl suture. Dermabond glue was placed over the incisions. Next, the hysteroscopy D and C was performed in routine manner. The patient's legs were placed in the high lithotomy position. The Hulka tenaculum was removed and a single-tooth tenaculum was placed on the anterior lip of the cervix. The uterus was sounded to 9 cm. The Hanks dilators up to a #20-French caliber were used to dilate the endocervical canal. The ACMI hysteroscope with lactated Ringer's as irrigant was used to perform hysteroscopy. The above-noted findings were photo documented. Both smooth and serrated curettes were then used to curettage the endometrial cavity. Stone polyp forceps were used to assess with extraction of tissue. Minimal tissue was obtained. Repeat hysteroscopy was performed following the curettage. The cavity demonstrated no significant residual tissue left behind. The procedure was then terminated with all instrumentation being removed from the vagina. The patient was then awakened, extubated and mobilized and taken to the recovery room in satisfactory condition.   ESTIMATED BLOOD LOSS: Minimal.   COMPLICATIONS: None.   COUNTS: All instrument, needle and sponge counts were verified as correct.   ADDENDUM NOTE: The patient is a good transvaginal hysterectomy candidate if hysterectomy is needed.  ____________________________ Daphine Deutscher  A.  Seraphine Gudiel, MD mad:sb D: 10/02/2014 10:48:52 ET T: 10/02/2014 11:34:00 ET JOB#: [id number]  cc: Daphine Deutscher A. Gearold Wainer, MD, <Dictator> Prentice Docker Myesha Stillion MD ELECTRONICALLY SIGNED 10/20/2014 10:54

## 2015-02-24 NOTE — H&P (Signed)
PATIENT NAME:  Zoe Ramos, Zoe Ramos MR#:  [id number] DATE OF BIRTH:  02-25-1976  DATE OF ADMISSION:  11/29/2013  PRIMARY CARE PHYSICIAN:  Cletis Athens, MD  CHIEF COMPLAINT:  Lightheadedness and dizziness.   HISTORY OF PRESENT ILLNESS: This is a 39 year old female with history of schizophrenia, anemia and depression. She presents with dizziness and lightheadedness for the past 3 weeks, some nausea, weakness, vomiting 3 to 4 times 2 weeks ago but nothing since. She does eat 2 to 3 cups of ice per day. She was guaiac-negative in the ER and found to have a hemoglobin of 5.6. Hospitalist services were contacted for further evaluation.   PAST MEDICAL HISTORY: Schizophrenia, depression and anemia.   PAST SURGICAL HISTORY: Cholecystectomy and hernia.   ALLERGIES: No known drug allergies.   MEDICATIONS: Include Depo-Provera 115 mg/mL IM suspension injection every 3 months, Haldol 5 mg/mL 1 mL injection once a month, Prilosec 20 mg daily,   SOCIAL HISTORY: Smokes 1 pack per day. No alcohol. No drug use. Is on disability.   FAMILY HISTORY: Father living with prostate cancer. Mother died of breast cancer age 69.   REVIEW OF SYSTEMS: CONSTITUTIONAL: Positive for weakness. No fever, chills or sweats. No weight loss. No weight gain.  EYES: No blurry vision.  EARS, NOSE, MOUTH AND THROAT: No hearing loss. No sore throat. No difficulty swallowing.  CARDIOVASCULAR: No chest pain. No palpitations.  RESPIRATORY: No shortness of breath. No coughing or hemoptysis.  GASTROINTESTINAL: Positive for nausea and vomiting. No hematemesis. No abdominal pain. No diarrhea. No constipation. No bright red blood per rectum. No melena.  GENITOURINARY: No burning on urination. No hematuria. No menstruation with the Depo shots but if she misses it she bleeds for a week.  MUSCULOSKELETAL: No joint pain or muscle pain.  INTEGUMENT: No rashes or eruptions.  NEUROLOGIC: No fainting or blackouts.  PSYCHIATRIC: Positive for  depression.  HEMATOLOGIC AND LYMPHATIC: Positive for anemia.   PHYSICAL EXAMINATION: VITAL SIGNS: Temperature 98.7, pulse 85, respirations 16, blood pressure 111/64, pulse ox 94% on room air.  GENERAL: No respiratory distress.  EYES: Conjunctivae pale and lids normal. Pupils equal, round and reactive to light. Extraocular muscles intact. No nystagmus.  EARS, NOSE, MOUTH AND THROAT: Tympanic membranes: No erythema. Nasal mucosa: No erythema. Throat: No erythema, no exudate seen. Lips and gums: No lesions.  NECK: No JVD. No bruits. No lymphadenopathy. No thyromegaly. No thyroid nodules palpated.  RESPIRATORY:  Lungs clear to auscultation. No use of accessory muscles to breathe. No rhonchi, rales or wheeze heard.  CARDIOVASCULAR: S1, S2 normal, positive 2 out of 6 systolic ejection murmur. Carotid upstroke 2+ bilaterally. No bruits.  EXTREMITIES: Dorsalis pedis pulses 2+ bilaterally. No edema of the lower extremity.  ABDOMEN: Soft, nontender. No organomegaly/splenomegaly. Normoactive bowel sounds. No masses felt.  LYMPHATIC: No lymph nodes in the neck.  MUSCULOSKELETAL: No clubbing, edema or cyanosis.  SKIN: No rashes or ulcers seen.  NEUROLOGIC: Cranial nerves II through XII grossly intact. Deep tendon reflexes are 2+ bilateral lower extremities.  PSYCHIATRIC: The patient is oriented to person, place and time.   LABORATORY, DIAGNOSTIC AND RADIOLOGICAL DATA:  Urinalysis 2+ leukocyte esterase. White blood cell count 4.8, H and H 5.6 and 20.6, platelet count 378, MCV 50. Glucose 85, BUN 7, creatinine 0.64, sodium 137, potassium 3.5, chloride 108, CO2 23, calcium 8.9, alkaline phosphatase 118, ALT 13, AST 24, total protein 7.9, albumin 3.4. Troponin negative. EKG normal sinus rhythm, no acute ST-T wave changes.   ASSESSMENT AND  PLAN: 1.  Anemia. We will transfuse 2 units of packed red blood cells over 4 hours each. The patient had severe anemia. We will send off iron studies to determine what type of  anemia. The patient is guaiac-negative.  2.  Pica. I have seen people with severe iron deficiency anemia who eat ice. I advised her not to eat ice anymore.   3.  Urinary tract infection. Will give IV Rocephin.  4.  Hypokalemia. Will give potassium supplementation.  5.  Tobacco abuse. Smoking cessation counseling done 3 minutes by me. Nicotine patch refused.   TIME SPENT ON ADMISSION: 50 minutes.    ____________________________ Tana Conch. Leslye Peer, MD rjw:cs D: 11/29/2013 20:21:00 ET T: 11/29/2013 20:38:12 ET JOB#: [id number]  cc: Tana Conch. Leslye Peer, MD, <Dictator> Cletis Athens, MD Marisue Brooklyn MD ELECTRONICALLY SIGNED 12/02/2013 15:27

## 2015-02-24 NOTE — H&P (Signed)
PATIENT NAME:  Zoe Ramos, Zoe Ramos MR#:  [id number] DATE OF BIRTH:  01-05-1976  DATE OF ADMISSION:  10/02/2014  DATE OF PROCEDURE: 10/02/2014.   PREOPERATIVE DIAGNOSES:  1.  Menorrhagia to anemia.  2.  Status post blood transfusion 06/05/2014.  3.  Chronic pelvic pain with severe dysmenorrhea.    HISTORY OF PRESENT ILLNESS:  The patient is a 39 year old African American female, para 4, 1-1-5, on Depo-Provera for contraception and cycle regulation, with a long history of dysmenorrhea and perimenstrual low backache, with significant uncontrolled abnormal uterine bleeding that prompted a transfusion on 06/05/2014, presents for surgical evaluation. The patient had preoperative ultrasound which was normal.  No uterine pathology was identified.   PAST MEDICAL HISTORY:  1.  Tobacco abuse.  2.  Abnormal uterine bleeding.  3.  Severe dysmenorrhea.  4.  Perimenstrual low back pain.  5.  Bipolar disorder, depressed, mild.  6.  Gastroesophageal reflux disease.  7.  Asthma.  8.  Headache.  9.  Anemia.   PAST SURGICAL HISTORY:  1.  Laparoscopic cholecystectomy.  2.  Hernia repair in 1997.   PAST OBSTETRIC HISTORY: Para 4, 1-0-5, all spontaneous vaginal deliveries with largest baby 7 pounds 3 ounces.   FAMILY HISTORY: Mother had breast cancer, no history of colon or ovary cancer.  Diabetes mellitus notable in father and sister.   SOCIAL HISTORY: The patient does not drink alcohol. She does smoke 1 pack of cigarettes a day.   DRUG ALLERGIES: None.   CURRENT MEDICATIONS: Haldol, Depo-Provera.   REVIEW OF SYSTEMS:  The patient denies any respiratory symptoms. She does have chronic pelvic pain and abnormal uterine bleeding. Bowel and bladder function are normal.   PHYSICAL EXAMINATION:  VITAL SIGNS: Weight 193.5 pounds. Height 67.6 inches, body mass index 29.77, blood pressure 107/74, heart rate 83.   GENERAL: The patient is a pleasant African American female in no acute distress. She is alert and  oriented.  SKIN: Without rash.   OROPHARYNX:  Clear.  NECK: Supple without thyromegaly or adenopathy.  LUNGS: Clear.  HEART: Regular rate and rhythm without murmur.  ABDOMEN: Soft and nontender. No organomegaly. A small umbilical hernia is noted.  There is a well-healed 10 cm transverse incision in the upper abdomen.  PELVIC: External genitalia normal. BUS normal. Vagina with good estrogen effect. Cervix without lesions. The uterus is mid-plane normal and of appropriate size and shape. It is tender 1/4. There were no adnexal masses.  EXTREMITIES: Without clubbing, cyanosis, or edema.  MUSCULOSKELETAL: Exam is normal.   IMPRESSION:  1.  Chronic pelvic pain with dysmenorrhea.  2.  History of abnormal uterine bleeding with need for transfusion.  3.  Normal pelvic ultrasound.   PLAN:  1.  Laparoscopy with peritoneal biopsies.  2.  Hysteroscopy with dilation and curettage.     PREOPERATIVE CONSENT:  The patient is to undergo laparoscopy with peritoneal biopsies for evaluation of chronic pelvic pain and severe dysmenorrhea. She also is to undergo hysteroscopy with dilation and curettage because of her history of abnormal uterine bleeding and anemia which has required transfusion in the past. The patient is understanding of the planned procedures and is aware of and is accepting of all surgical risks which include, but are not limited to, bleeding, infection, pelvic organ injury with need for repair, uterine perforation, anesthesia risks, etc.  All questions are answered.  Informed consent is given. The patient is ready and willing to proceed with surgery as scheduled.  ____________________________ Zoe Docker [name], MD mad:DT D: 10/02/2014 07:54:14 ET T: 10/02/2014 08:30:23 ET JOB#: [id number]  cc: Daphine Deutscher A. Mei Suits, MD, <Dictator> [facility] Zoe Docker [name] MD ELECTRONICALLY SIGNED 10/20/2014 10:54

## 2015-02-24 NOTE — Consult Note (Signed)
VITAL SIGNS/ANCILLARY NOTES: **Vital Signs.:   29-Jan-15 09:40  Vital Signs Type Q 4hr  Temperature Temperature (F) 98.6  Celsius 37  Pulse Pulse 71  Respirations Respirations 18  Systolic BP Systolic BP 735  Diastolic BP (mmHg) Diastolic BP (mmHg) 66  Mean BP 78  Pulse Ox % Pulse Ox % 96  Pulse Ox Activity Level  At rest  Oxygen Delivery Room Air/ 21 %   Brief Assessment:  Cardiac Regular   Respiratory clear BS   Gastrointestinal details normal Soft  Nontender  Nondistended  No masses palpable  Bowel sounds normal   Lab Results: Routine Chem:  29-Jan-15 04:28   Glucose, Serum 87  BUN  5  Creatinine (comp) 0.87  Sodium, Serum 138  Potassium, Serum 4.1  Chloride, Serum  108  CO2, Serum 26  Calcium (Total), Serum 8.7  Anion Gap  4  Osmolality (calc) 272  eGFR (African American) >60  eGFR (Non-African American) >60 (eGFR values <20mL/min/1.73 m2 may be an indication of chronic kidney disease (CKD). Calculated eGFR is useful in patients with stable renal function. The eGFR calculation will not be reliable in acutely ill patients when serum creatinine is changing rapidly. It is not useful in  patients on dialysis. The eGFR calculation may not be applicable to patients at the low and high extremes of body sizes, pregnant women, and vegetarians.)  Routine Hem:  27-Jan-15 17:18   Hemoglobin (CBC)  5.6  Platelet Count (CBC) 378 (Result(s) reported on 29 Nov 2013 at 05:45PM.)  28-Jan-15 09:20   Hemoglobin (CBC)  7.6  Platelet Count (CBC) 323 (Result(s) reported on 30 Nov 2013 at 10:10AM.)  29-Jan-15 04:28   WBC (CBC) 5.2  RBC (CBC) 4.23  Hemoglobin (CBC)  7.4  Hematocrit (CBC)  24.2  Platelet Count (CBC) 262 (Result(s) reported on 01 Dec 2013 at [facility].)  MCV  57  MCH  17.5  MCHC  30.6  RDW  31.5  Segmented Neutrophils 50  Lymphocytes 42  Monocytes 4  Eosinophil 2  Basophil 1  Myelocyte 1  Diff Comment 1 POIKILOCYTOSIS  Diff Comment 2 ANISOCYTOSIS  Diff  Comment 3 PLTS VARIED IN SIZE  Result(s) reported on 01 Dec 2013 at 06:07AM.   Assessment/Plan:  Assessment/Plan:  Assessment 1) microcytic anemia, iron deficiency.  patient heme neg on exam.  etiology uncertain. 2) h/o bipolar/schizophrenia/depression.   Plan 1) egd today.  I have discussed the risks benefits and complicatiosn of egd to include not limited to bleeding infectiion perforation and sedation and she wishnes to proceed.  If egd uninformative, will arrange colononoscopy, an be done inpatient or outpatient.   2) recommend hematology consult, hgb electrophoresis pending.   Electronic Signatures: Loistine Simas (MD)  (Signed 29-Jan-15 12:39)  Authored: VITAL SIGNS/ANCILLARY NOTES, Brief Assessment, Lab Results, Assessment/Plan   Last Updated: 29-Jan-15 12:39 by Loistine Simas (MD)

## 2015-02-24 NOTE — Consult Note (Signed)
Chief Complaint:  Subjective/Chief Complaint seen for anemia.  patient denies n or abdominal pain.   VITAL SIGNS/ANCILLARY NOTES: **Vital Signs.:   30-Jan-15 14:37  Vital Signs Type Routine  Temperature Temperature (F) 98.3  Celsius 36.8  Pulse Pulse 78  Respirations Respirations 18  Systolic BP Systolic BP 741  Diastolic BP (mmHg) Diastolic BP (mmHg) 70  Mean BP 84  Pulse Ox % Pulse Ox % 99  Pulse Ox Activity Level  At rest  Oxygen Delivery Room Air/ 21 %   Brief Assessment:  Cardiac Regular   Respiratory clear BS   Gastrointestinal details normal Soft  Nontender  Nondistended  No masses palpable  Bowel sounds normal   EXTR negative cyanosis/clubbing   Lab Results: General Ref:  28-Jan-15 09:20   Hemoglobinopathy Profile ========== TEST NAME ==========  ========= RESULTS =========  = REFERENCE RANGE =  HGB ELECTROPHORESIS  Hgb Frac. Profile Hgb Solubility                  [   Negative             ]          Negative Hgb F                           [   0.0 %           ]           0.0-2.0 Hgb A                           [   97.2 %               ]         94.0-98.0 Hgb S                           [   0.0 %                ]               0.0 Hgb C                           [   0.0 %                ]          0.0 Hgb A2                          [   2.8 %                ]           0.7-3.1 Hgb Variant Interpretation                  [   Final Report         ]                   Normal adult hemoglobin present.               [facility]            No: [id number]           [address] [place], [place] [address]  Lindon Romp, MD         [phone]   Result(s) reported on 01 Dec 2013 at 05:25PM.  Routine Chem:  30-Jan-15 05:41   Result Comment platelet - VERIFIED BY SMEAR ESTIMATE  Result(s) reported on 02 Dec 2013 at 07:13AM.  Routine Hem:  27-Jan-15 17:18   Hemoglobin (CBC)  5.6  28-Jan-15 09:20   Hemoglobin (CBC)  7.6  29-Jan-15  04:28   Hemoglobin (CBC)  7.4  30-Jan-15 05:41   WBC (CBC) 5.2  RBC (CBC) 4.39  Hemoglobin (CBC)  7.6  Hematocrit (CBC)  25.0  Platelet Count (CBC) 255  MCV  57  MCH  17.4  MCHC  30.5  RDW  32.8  Neutrophil % 55.4  Lymphocyte % 33.2  Monocyte % 6.9  Eosinophil % 3.6  Basophil % 0.9  Neutrophil # 2.9  Lymphocyte # 1.7  Monocyte # 0.4  Eosinophil # 0.2  Basophil # 0.0   Radiology Results: CT:    29-Jan-15 18:22, CT Chest, Abd, and Pelvis With Contrast  CT Chest, Abd, and Pelvis With Contrast   REASON FOR EXAM:    (1) large hiatal hernia with possible intrathoracic;   (2) anemia  COMMENTS:       PROCEDURE: CT  - CT CHEST ABDOMEN AND PELVIS W  - Dec 01 2013  6:22PM     CLINICAL DATA:  Large hiatal hernia on endoscopy earlier today.  Anemia.    EXAM:  CT CHEST, ABDOMEN, AND PELVIS WITH CONTRAST    TECHNIQUE:  Multidetector CT imaging of the chest, abdomen and pelvis was  performed following the standard protocol during bolus  administration of intravenous contrast.    CONTRAST:  100 cc Isovue 370.    COMPARISON:  None.    FINDINGS:  CT CHEST FINDINGS    No pathologically enlarged mediastinal, hilar or axillary lymph  nodes. Heart size normal. No pericardial effusion.    Large hiatal hernia with approximately 50% of the stomach locatedin  the chest.  A few scattered tiny pulmonary nodules measure up to 4 mm in the  left lower lobe (image 24). No pleural fluid. Airway is  unremarkable.    CT ABDOMEN AND PELVIS FINDINGS    Liver is unremarkable. Cholecystectomy. Adrenal glands are  unremarkable. There may be a tiny stone in the lower pole right  kidney. Kidneys, spleen, pancreas, stomach and bowel are otherwise  unremarkable. Uterus and ovaries are visualized. Scattered lymph  nodes are not enlarged by CT size criteria. Incisional scarring is  seen in the region of the umbilicus. No pleural fluid. No worrisome  lytic or sclerotic lesions.      IMPRESSION:  1. Large hiatal hernia with approximately 50% of the stomach located  in the chest.  2. Scattered tiny pulmonary nodules are nonspecific. If the patient  is at high risk for bronchogenic carcinoma, follow-up chest CT at 1  year is recommended. If the patient is at low risk, no follow-up is  needed. This recommendation follows the consensus statement:  Guidelines for Management of Small Pulmonary Nodules Detected on CT  Scans: A Statement from the [employer] as published in  Radiology 2005; 237:395-400.  3. Difficult to exclude a tiny right renal stone.      Electronically Signed    By: Lorin Picket M.D.   On: 12/01/2013 18:58     Verified By: Luretha Rued, M.D.,   Assessment/Plan:  Assessment/Plan:  Assessment 1) anemia, marked microcytosis.  EGD showing  large intrathoracic hiatal hernia, no apparent cameron lesions.  CT verifies finding.  cameron lesions can come and go and be contributory to marked IDA with only intermittant heme positive. Of note patient heme negative on 2 checks this hospitalization.   Plan 1) would recommend further opinion/evaluation by surgery.  Will also need GI o/p fu with UGIS as well, possible colonoscopy. Dr Allen Norris covering this weekend if needed.   Electronic Signatures: Loistine Simas (MD)  (Signed 30-Jan-15 17:22)  Authored: Chief Complaint, VITAL SIGNS/ANCILLARY NOTES, Brief Assessment, Lab Results, Radiology Results, Assessment/Plan   Last Updated: 30-Jan-15 17:22 by Loistine Simas (MD)

## 2015-02-26 LAB — SURGICAL PATHOLOGY

## 2015-03-06 ENCOUNTER — Emergency Department
Admission: EM | Admit: 2015-03-06 | Discharge: 2015-03-07 | Disposition: A | Discharge status: Home / Self Care | Payer: Medicaid Other | Attending: Emergency Medicine | Admitting: Emergency Medicine

## 2015-03-06 ENCOUNTER — Encounter: Payer: Self-pay | Admitting: Emergency Medicine

## 2015-03-06 ENCOUNTER — Emergency Department: Payer: Medicaid Other

## 2015-03-06 DIAGNOSIS — Z3A01 Less than 8 weeks gestation of pregnancy: Secondary | ICD-10-CM | POA: Insufficient documentation

## 2015-03-06 DIAGNOSIS — O209 Hemorrhage in early pregnancy, unspecified: Secondary | ICD-10-CM | POA: Diagnosis present

## 2015-03-06 DIAGNOSIS — F17210 Nicotine dependence, cigarettes, uncomplicated: Secondary | ICD-10-CM | POA: Insufficient documentation

## 2015-03-06 DIAGNOSIS — O99331 Smoking (tobacco) complicating pregnancy, first trimester: Secondary | ICD-10-CM | POA: Insufficient documentation

## 2015-03-06 DIAGNOSIS — O200 Threatened abortion: Secondary | ICD-10-CM | POA: Insufficient documentation

## 2015-03-06 DIAGNOSIS — O2341 Unspecified infection of urinary tract in pregnancy, first trimester: Secondary | ICD-10-CM | POA: Insufficient documentation

## 2015-03-06 HISTORY — DX: Anemia, unspecified: D64.9

## 2015-03-06 LAB — OB RESULTS CONSOLE VARICELLA ZOSTER ANTIBODY, IGG: Varicella: IMMUNE

## 2015-03-06 LAB — CBC
HCT: 35.6 % (ref 35.0–47.0)
Hemoglobin: 11.3 g/dL — ABNORMAL LOW (ref 12.0–16.0)
MCH: 19.9 pg — ABNORMAL LOW (ref 26.0–34.0)
MCHC: 31.6 g/dL — ABNORMAL LOW (ref 32.0–36.0)
MCV: 63 fL — ABNORMAL LOW (ref 80.0–100.0)
Platelets: 309 10*3/uL (ref 150–440)
RBC: 5.65 MIL/uL — ABNORMAL HIGH (ref 3.80–5.20)
RDW: 15.8 % — ABNORMAL HIGH (ref 11.5–14.5)
WBC: 6.8 10*3/uL (ref 3.6–11.0)

## 2015-03-06 LAB — OB RESULTS CONSOLE GC/CHLAMYDIA
Chlamydia: NEGATIVE
Gonorrhea: NEGATIVE

## 2015-03-06 LAB — OB RESULTS CONSOLE HIV ANTIBODY (ROUTINE TESTING): HIV: NONREACTIVE

## 2015-03-06 LAB — URINALYSIS COMPLETE WITH MICROSCOPIC (ARMC ONLY)
Bilirubin Urine: NEGATIVE
Glucose, UA: NEGATIVE mg/dL
Ketones, ur: NEGATIVE mg/dL
Nitrite: NEGATIVE
Protein, ur: NEGATIVE mg/dL
Specific Gravity, Urine: 1.011 (ref 1.005–1.030)
pH: 6 (ref 5.0–8.0)

## 2015-03-06 LAB — OB RESULTS CONSOLE RUBELLA ANTIBODY, IGM: Rubella: IMMUNE

## 2015-03-06 LAB — OB RESULTS CONSOLE HEPATITIS B SURFACE ANTIGEN: Hepatitis B Surface Ag: NEGATIVE

## 2015-03-06 LAB — OB RESULTS CONSOLE ANTIBODY SCREEN: Antibody Screen: NEGATIVE

## 2015-03-06 LAB — OB RESULTS CONSOLE RPR: RPR: NONREACTIVE

## 2015-03-06 NOTE — ED Notes (Signed)
Urine pregnancy positive

## 2015-03-06 NOTE — ED Provider Notes (Signed)
[facility] Emergency Department Provider Note   Time seen: 9:39 PM  I have reviewed the triage vital signs and the nursing notes.   HISTORY  Chief Complaint Vaginal Bleeding and Urinary Tract Infection    HPI Zoe Ramos is a 39 y.o. female who presents ER for 6 week pregnancy and small amount bleeding started today. She is G6 P5 AB 1 which point gestation. She thinks she may be having UTI because she 7 urinary frequency. Denies any pain currently vaginal bleeding and is mild, less than a menstrual Cycle.  Past Medical History  Diagnosis Date  . Anemia     There are no active problems to display for this patient.   Past Surgical History  Procedure Laterality Date  . Hernia repair      No current outpatient prescriptions on file.  Allergies Morphine and related  History reviewed. No pertinent family history.  Social History History  Substance Use Topics  . Smoking status: Current Every Day Smoker -- 5.00 packs/day for 20 years    Types: Cigarettes  . Smokeless tobacco: Not on file  . Alcohol Use: No    Review of Systems Constitutional: Negative for fever. Eyes: Negative for visual changes. ENT: Negative for sore throat. Cardiovascular: Negative for chest pain. Respiratory: Negative for shortness of breath. Gastrointestinal: Negative for abdominal pain, vomiting and diarrhea. Genitourinary: dysuria Musculoskeletal: Negative for back pain. Skin: Negative for rash. Neurological: Negative for headaches, focal weakness or numbness.  10-point ROS otherwise negative.  ____________________________________________   PHYSICAL EXAM:  VITAL SIGNS: ED Triage Vitals  Enc Vitals Group     BP 03/06/15 2047 109/75 mmHg     Pulse Rate 03/06/15 2047 86     Resp 03/06/15 2047 18     Temp 03/06/15 2047 98.5 F (36.9 C)     Temp Source 03/06/15 2047 Oral     SpO2 03/06/15 2047 97 %     Weight 03/06/15 2047 199 lb (90.266 kg)     Height  03/06/15 2047 5\' 7"  (1.702 m)     Head Cir --      Peak Flow --      Pain Score --      Pain Loc --      Pain Edu? --      Excl. in [place]? --     Constitutional: Alert and oriented. Well appearing and in no distress. Eyes: Conjunctivae are normal. PERRL. Normal extraocular movements. ENT   Head: Normocephalic and atraumatic.   Nose: No congestion/rhinnorhea.   Mouth/Throat: Mucous membranes are moist.   Neck: No stridor. Hematological/Lymphatic/Immunilogical: No cervical lymphadenopathy. Cardiovascular: Normal rate, regular rhythm. Normal and symmetric distal pulses are present in all extremities. No murmurs, rubs, or gallops. Respiratory: Normal respiratory effort without tachypnea nor retractions. Breath sounds are clear and equal bilaterally. No wheezes/rales/rhonchi. Gastrointestinal: Soft and nontender. No distention. No abdominal bruits. There is no CVA tenderness. Musculoskeletal: Nontender with normal range of motion in all extremities. No joint effusions.  No lower extremity tenderness nor edema. Neurologic:  Normal speech and language. No gross focal neurologic deficits are appreciated. Speech is normal. No gait instability. Skin:  Skin is warm, dry and intact. No rash noted. Psychiatric: Mood and affect are normal. Speech and behavior are normal. Patient exhibits appropriate insight and judgment.  ____________________________________________    LABS (pertinent positives/negatives)  Pending   ____________________________________________       RADIOLOGY  Ultrasound of the pelvis  ____________________________________________  IMPRESSION / ASSESSMENT AND PLAN / ED COURSE  Pertinent labs & imaging results that were available during my care of the patient were reviewed by me and considered in my medical decision making (see chart for details).  Threatened AB, UTI  Ultrasound pending as well as basic lab work and urinalysis.  Patient signed out to  Dr. Dahlia Client at the end of my shift. Patient is in no acute distress at this time.   Earleen Newport, MD   Earleen Newport, MD 03/06/15 [phone]

## 2015-03-06 NOTE — ED Notes (Signed)
Pt uprite on stretcher in exam room watching TV; no distress noted, no c/o voiced at present; family at bedside; pt requesting to eat & drink; notified of need to await results of u/s prior to doing such; pt voices understanding

## 2015-03-06 NOTE — ED Notes (Signed)
Patient states that she is about [redacted] weeks pregnant and started having a small amount of vaginal bleeding around 15:00 today. Patient states that she thinks it maybe from a UTI that she is having urinary frequency.

## 2015-03-07 LAB — HCG, QUANTITATIVE, PREGNANCY: hCG, Beta Chain, Quant, S: 27581 m[IU]/mL — ABNORMAL HIGH (ref <5)

## 2015-03-07 LAB — ABO/RH: ABO/RH(D): B POS

## 2015-03-07 MED ORDER — NITROFURANTOIN MONOHYD MACRO 100 MG PO CAPS
100.0000 mg | ORAL_CAPSULE | Freq: Once | ORAL | Status: AC
  Filled 2015-03-07: qty 1

## 2015-03-07 MED ORDER — NITROFURANTOIN MACROCRYSTAL 100 MG PO CAPS
ORAL_CAPSULE | ORAL | Status: AC
Start: 2015-03-07 — End: 2015-03-07
  Administered 2015-03-07: 02:00:00
  Filled 2015-03-07: qty 1

## 2015-03-07 MED ORDER — NITROFURANTOIN MACROCRYSTAL 100 MG PO CAPS: 100.0000 mg | ORAL_CAPSULE | Freq: Four times a day (QID) | ORAL | Status: DC

## 2015-03-07 NOTE — Discharge Instructions (Signed)
Asymptomatic Bacteriuria Asymptomatic bacteriuria is the presence of a large number of bacteria in your urine without the usual symptoms of burning or frequent urination. The following conditions increase the risk of asymptomatic bacteriuria:  Diabetes mellitus.  Advanced age.  Pregnancy in the first trimester.  Kidney stones.  Kidney transplants.  Leaky kidney tube valve in young children (reflux). Treatment for this condition is not needed in most people and can lead to other problems such as too much yeast and growth of resistant bacteria. However, some people, such as pregnant women, do need treatment to prevent kidney infection. Asymptomatic bacteriuria in pregnancy is also associated with fetal growth restriction, premature labor, and newborn death. HOME CARE INSTRUCTIONS Monitor your condition for any changes. The following actions may help to relieve any discomfort you are feeling:  Drink enough water and fluids to keep your urine clear or pale yellow. Go to the bathroom more often to keep your bladder empty.  Keep the area around your vagina and rectum clean. Wipe yourself from front to back after urinating. SEEK IMMEDIATE MEDICAL CARE IF:  You develop signs of an infection such as:  Burning with urination.  Frequency of voiding.  Back pain.  Fever.  You have blood in the urine.  You develop a fever. MAKE SURE YOU:  Understand these instructions.  Will watch your condition.  Will get help right away if you are not doing well or get worse. Document Released: 10/20/2005 Document Revised: 03/06/2014 Document Reviewed: 04/11/2013 [facility] Patient Information 2015 [place], [place]. This information is not intended to replace advice given to you by your health care provider. Make sure you discuss any questions you have with your health care provider.  Threatened Miscarriage A threatened miscarriage occurs when you have vaginal bleeding during your first 20 weeks of  pregnancy but the pregnancy has not ended. If you have vaginal bleeding during this time, your health care provider will do tests to make sure you are still pregnant. If the tests show you are still pregnant and the developing baby (fetus) inside your womb (uterus) is still growing, your condition is considered a threatened miscarriage. A threatened miscarriage does not mean your pregnancy will end, but it does increase the risk of losing your pregnancy (complete miscarriage). CAUSES  The cause of a threatened miscarriage is usually not known. If you go on to have a complete miscarriage, the most common cause is an abnormal number of chromosomes in the developing baby. Chromosomes are the structures inside cells that hold all your genetic material. Some causes of vaginal bleeding that do not result in miscarriage include:  Having sex.  Having an infection.  Normal hormone changes of pregnancy.  Bleeding that occurs when an egg implants in your uterus. RISK FACTORS Risk factors for bleeding in early pregnancy include:  Obesity.  Smoking.  Drinking excessive amounts of alcohol or caffeine.  Recreational drug use. SIGNS AND SYMPTOMS  Light vaginal bleeding.  Mild abdominal pain or cramps. DIAGNOSIS  If you have bleeding with or without abdominal pain before 20 weeks of pregnancy, your health care provider will do tests to check whether you are still pregnant. One important test involves using sound waves and a computer (ultrasound) to create images of the inside of your uterus. Other tests include an internal exam of your vagina and uterus (pelvic exam) and measurement of your baby's heart rate.  You may be diagnosed with a threatened miscarriage if:  Ultrasound testing shows you are still pregnant.  Your  baby's heart rate is strong.  A pelvic exam shows that the opening between your uterus and your vagina (cervix) is closed.  Your heart rate and blood pressure are stable.  Blood  tests confirm you are still pregnant. TREATMENT  No treatments have been shown to prevent a threatened miscarriage from going on to a complete miscarriage. However, the right home care is important.  HOME CARE INSTRUCTIONS   Make sure you keep all your appointments for prenatal care. This is very important.  Get plenty of rest.  Do not have sex or use tampons if you have vaginal bleeding.  Do not douche.  Do not smoke or use recreational drugs.  Do not drink alcohol.  Avoid caffeine. SEEK MEDICAL CARE IF:  You have light vaginal bleeding or spotting while pregnant.  You have abdominal pain or cramping.  You have a fever. SEEK IMMEDIATE MEDICAL CARE IF:  You have heavy vaginal bleeding.  You have blood clots coming from your vagina.  You have severe low back pain or abdominal cramps.  You have fever, chills, and severe abdominal pain. MAKE SURE YOU:  Understand these instructions.  Will watch your condition.  Will get help right away if you are not doing well or get worse. Document Released: 10/20/2005 Document Revised: 10/25/2013 Document Reviewed: 08/16/2013 [facility] Patient Information 2015 [place], [place]. This information is not intended to replace advice given to you by your health care provider. Make sure you discuss any questions you have with your health care provider.

## 2015-03-07 NOTE — ED Notes (Signed)
Called lab again to inquire about group and rH; st will return call

## 2015-03-07 NOTE — ED Notes (Signed)
Patient discharged to home. Patient in stable condition, and deemed medically cleared by ED provider for discharge. Discharge instructions reviewed with patient using "Teach Back"; patient verbalized understanding of medication education and administration, and information about follow-up care. Patient denies further concerns. Patient escorted/transported home by .

## 2015-03-07 NOTE — ED Notes (Signed)
Called lab to inquire over pending HCG and Group, Rh per Dr Dahlia Client request; informed that "it is running now"

## 2015-03-07 NOTE — ED Notes (Signed)
Pt uprite on stretcher in exam room watching TV; no distress noted, no c/o voiced at present; family at bedside; pt requesting to eat & drink; notified of need to await results of u/s prior to doing such; pt voices understanding

## 2015-03-07 NOTE — ED Provider Notes (Signed)
-----------------------------------------   1:19 AM on 03/07/2015 -----------------------------------------  Assuming care from Dr. Jimmye Norman.  In short, Zoe Ramos is a 39 y.o. female with a chief complaint of Vaginal Bleeding and Urinary Tract Infection .  Refer to the original H&P for additional details.  The current plan of care is to follow-up the ultrasound results.  The ultrasound shows a single living intrauterine gestation measuring 6 weeks 1 day by crown-rump length The patient's blood type is B+ The patient also has many bacteria in her urinalysis. I will discharge the patient on nitrofurantoin and have her follow up with OB/GYN.     Loney Hering, MD 03/07/15 0120

## 2015-04-06 ENCOUNTER — Telehealth: Payer: Self-pay | Admitting: Obstetrics and Gynecology

## 2015-04-06 NOTE — Telephone Encounter (Signed)
Pt would like some phenergan sent in for nausea, pls advise

## 2015-04-10 ENCOUNTER — Other Ambulatory Visit: Payer: Self-pay | Admitting: Obstetrics and Gynecology

## 2015-04-10 MED ORDER — PROMETHAZINE HCL 25 MG PO TABS: 25.0000 mg | ORAL_TABLET | Freq: Four times a day (QID) | ORAL | Status: DC | PRN

## 2015-04-10 MED ORDER — DOXYLAMINE-PYRIDOXINE 10-10 MG PO TBEC: 2.0000 | DELAYED_RELEASE_TABLET | Freq: Every day | ORAL | Status: DC

## 2015-04-10 NOTE — Telephone Encounter (Signed)
Please let her know a rx was sent in for phenergan and also diclegis- have her stop by and pick up sample of diclegis and instruct on use

## 2015-04-10 NOTE — Telephone Encounter (Signed)
Faxed to pt's pharmacy.

## 2015-04-15 ENCOUNTER — Encounter: Payer: Self-pay | Admitting: *Deleted

## 2015-04-17 ENCOUNTER — Encounter: Payer: Self-pay | Admitting: Obstetrics and Gynecology

## 2015-04-19 ENCOUNTER — Encounter: Payer: Self-pay | Admitting: Obstetrics and Gynecology

## 2015-04-19 ENCOUNTER — Ambulatory Visit (INDEPENDENT_AMBULATORY_CARE_PROVIDER_SITE_OTHER): Payer: Medicaid Other | Admitting: Obstetrics and Gynecology

## 2015-04-19 VITALS — BP 113/67 | HR 86 | Wt 207.0 lb

## 2015-04-19 DIAGNOSIS — O09522 Supervision of elderly multigravida, second trimester: Secondary | ICD-10-CM

## 2015-04-19 DIAGNOSIS — Z3492 Encounter for supervision of normal pregnancy, unspecified, second trimester: Secondary | ICD-10-CM

## 2015-04-19 LAB — POCT URINALYSIS DIPSTICK
Bilirubin, UA: NEGATIVE
Glucose, UA: NEGATIVE
Ketones, UA: 5
Nitrite, UA: NEGATIVE
Protein, UA: 100
Spec Grav, UA: 1.01
Urobilinogen, UA: 0.2
pH, UA: 7

## 2015-04-19 NOTE — Progress Notes (Signed)
Pt c/o headaches, still with nausea, some spotting

## 2015-04-19 NOTE — Patient Instructions (Signed)
  Place 10-18 weeks prenatal visit patient instructions here.

## 2015-04-19 NOTE — Progress Notes (Signed)
NOB PE- 39yo SBF [id number] with one previous 31 week delivery. Doing well except occassional HAs and spotting vaginally last week- wetprep negative; urine sent for culture. Desires genetic screening due to [place] obtained.

## 2015-04-23 ENCOUNTER — Telehealth: Payer: Self-pay | Admitting: Obstetrics and Gynecology

## 2015-04-23 NOTE — Telephone Encounter (Signed)
Spoke w/pt she wanted to know about her ua that was done in the office

## 2015-04-23 NOTE — Telephone Encounter (Signed)
URINE LAB RESULTS PLEASE

## 2015-04-25 ENCOUNTER — Telehealth: Payer: Self-pay | Admitting: Obstetrics and Gynecology

## 2015-04-25 NOTE — Telephone Encounter (Signed)
Pt called and she has been receiving calls from her pharmacy saying her RX promethazine was ready for pick up but when she goes there they tell her that its nothing there and that it needs to be prior authorized, she stated this has been going on since 6/16, do you know if a prior auth. Has been done for this medication for her? She would like a call back when its fixed.

## 2015-04-26 ENCOUNTER — Encounter: Payer: Self-pay | Admitting: Obstetrics and Gynecology

## 2015-05-02 NOTE — Telephone Encounter (Signed)
Left detailed message for pt to call me, have tried calling pt several times, her insurance isnt going to pay for the phenegran, will discuss with pt at next office visit

## 2015-05-17 ENCOUNTER — Ambulatory Visit (INDEPENDENT_AMBULATORY_CARE_PROVIDER_SITE_OTHER): Payer: Medicaid Other | Admitting: Obstetrics and Gynecology

## 2015-05-17 ENCOUNTER — Encounter: Payer: Self-pay | Admitting: Obstetrics and Gynecology

## 2015-05-17 VITALS — BP 108/75 | HR 102 | Wt 204.8 lb

## 2015-05-17 DIAGNOSIS — O09522 Supervision of elderly multigravida, second trimester: Secondary | ICD-10-CM

## 2015-05-17 DIAGNOSIS — O09892 Supervision of other high risk pregnancies, second trimester: Secondary | ICD-10-CM

## 2015-05-17 DIAGNOSIS — O09219 Supervision of pregnancy with history of pre-term labor, unspecified trimester: Secondary | ICD-10-CM

## 2015-05-17 DIAGNOSIS — O09212 Supervision of pregnancy with history of pre-term labor, second trimester: Secondary | ICD-10-CM

## 2015-05-17 DIAGNOSIS — O09529 Supervision of elderly multigravida, unspecified trimester: Secondary | ICD-10-CM | POA: Insufficient documentation

## 2015-05-17 DIAGNOSIS — O09899 Supervision of other high risk pregnancies, unspecified trimester: Secondary | ICD-10-CM | POA: Insufficient documentation

## 2015-05-17 DIAGNOSIS — Z3492 Encounter for supervision of normal pregnancy, unspecified, second trimester: Secondary | ICD-10-CM

## 2015-05-17 LAB — POCT URINALYSIS DIPSTICK
Blood, UA: NEGATIVE
Glucose, UA: NEGATIVE
Ketones, UA: 5
Nitrite, UA: NEGATIVE
Spec Grav, UA: 1.025
Urobilinogen, UA: 0.2
pH, UA: 6

## 2015-05-17 NOTE — Progress Notes (Signed)
Pt is c/o nausea is taking phenegran however it makes her very sleepy

## 2015-05-17 NOTE — Progress Notes (Signed)
ROB- reviewed low risk female genetic results; recommend pt take 1/2 tablet of phenergan during daytime and whole tablet at night (offered Zofran- but states it doesn't work for her); Anatomy scan next visit

## 2015-05-17 NOTE — Patient Instructions (Signed)
  Place 10-18 weeks prenatal visit patient instructions here.  Thank you for enrolling in [place]. Please follow the instructions below to securely access your online medical record. MyChart allows you to send messages to your doctor, view your test results, manage appointments, and more.   How Do I Sign Up? 1. In your Internet browser, go to [employer] and enter [email or web address]. 2. Click on the Sign Up Now link in the Sign In box. You will see the New Member Sign Up page. 3. Enter your MyChart Access Code exactly as it appears below. You will not need to use this code after you've completed the sign-up process. If you do not sign up before the expiration date, you must request a new code.  MyChart Access Code: [email or web address] Expires: 07/16/2015 10:59 AM  4. Enter your Social Security Number ([email or web address]) and Date of Birth (mm/dd/yyyy) as indicated and click Submit. You will be taken to the next sign-up page. 5. Create a MyChart ID. This will be your MyChart login ID and cannot be changed, so think of one that is secure and easy to remember. 6. Create a MyChart password. You can change your password at any time. 7. Enter your Password Reset Question and Answer. This can be used at a later time if you forget your password.  8. Enter your e-mail address. You will receive e-mail notification when new information is available in [place]. 9. Click Sign Up. You can now view your medical record.   Additional Information Remember, MyChart is NOT to be used for urgent needs. For medical emergencies, dial 911.

## 2015-05-21 ENCOUNTER — Emergency Department
Admission: EM | Admit: 2015-05-21 | Discharge: 2015-05-21 | Disposition: A | Discharge status: Home / Self Care | Payer: Medicaid Other | Attending: Emergency Medicine | Admitting: Emergency Medicine

## 2015-05-21 ENCOUNTER — Emergency Department: Payer: Medicaid Other

## 2015-05-21 ENCOUNTER — Encounter: Payer: Self-pay | Admitting: Emergency Medicine

## 2015-05-21 DIAGNOSIS — F17210 Nicotine dependence, cigarettes, uncomplicated: Secondary | ICD-10-CM | POA: Insufficient documentation

## 2015-05-21 DIAGNOSIS — Z3A18 18 weeks gestation of pregnancy: Secondary | ICD-10-CM | POA: Insufficient documentation

## 2015-05-21 DIAGNOSIS — Z79899 Other long term (current) drug therapy: Secondary | ICD-10-CM | POA: Diagnosis not present

## 2015-05-21 DIAGNOSIS — O209 Hemorrhage in early pregnancy, unspecified: Secondary | ICD-10-CM | POA: Diagnosis present

## 2015-05-21 DIAGNOSIS — O200 Threatened abortion: Secondary | ICD-10-CM | POA: Insufficient documentation

## 2015-05-21 DIAGNOSIS — O99332 Smoking (tobacco) complicating pregnancy, second trimester: Secondary | ICD-10-CM | POA: Insufficient documentation

## 2015-05-21 LAB — CBC WITH DIFFERENTIAL/PLATELET
Basophils Absolute: 0 10*3/uL (ref 0–0.1)
Basophils Relative: 0 %
Eosinophils Absolute: 0.1 10*3/uL (ref 0–0.7)
Eosinophils Relative: 1 %
HCT: 31.9 % — ABNORMAL LOW (ref 35.0–47.0)
Hemoglobin: 10 g/dL — ABNORMAL LOW (ref 12.0–16.0)
Lymphocytes Relative: 20 %
Lymphs Abs: 1.4 10*3/uL (ref 1.0–3.6)
MCH: 19.7 pg — ABNORMAL LOW (ref 26.0–34.0)
MCHC: 31.2 g/dL — ABNORMAL LOW (ref 32.0–36.0)
MCV: 63.1 fL — ABNORMAL LOW (ref 80.0–100.0)
Monocytes Absolute: 0.4 10*3/uL (ref 0.2–0.9)
Monocytes Relative: 6 %
Neutro Abs: 5.2 10*3/uL (ref 1.4–6.5)
Neutrophils Relative %: 73 %
Platelets: 253 10*3/uL (ref 150–440)
RBC: 5.05 MIL/uL (ref 3.80–5.20)
RDW: 15.4 % — ABNORMAL HIGH (ref 11.5–14.5)
WBC: 7.1 10*3/uL (ref 3.6–11.0)

## 2015-05-21 LAB — BASIC METABOLIC PANEL
Anion gap: 7 (ref 5–15)
BUN: 6 mg/dL (ref 6–20)
CO2: 23 mmol/L (ref 22–32)
Calcium: 8.4 mg/dL — ABNORMAL LOW (ref 8.9–10.3)
Chloride: 107 mmol/L (ref 101–111)
Creatinine, Ser: 0.49 mg/dL (ref 0.44–1.00)
GFR calc Af Amer: >60 mL/min (ref >60)
GFR calc non Af Amer: >60 mL/min (ref >60)
Glucose, Bld: 75 mg/dL (ref 65–99)
Potassium: 3.4 mmol/L — ABNORMAL LOW (ref 3.5–5.1)
Sodium: 137 mmol/L (ref 135–145)

## 2015-05-21 LAB — ABO/RH: ABO/RH(D): B POS

## 2015-05-21 LAB — HCG, QUANTITATIVE, PREGNANCY: hCG, Beta Chain, Quant, S: 23615 m[IU]/mL — ABNORMAL HIGH (ref <5)

## 2015-05-21 NOTE — ED Notes (Signed)
Pt [redacted] weeks pregnant.  Reports 7th baby. C/o cramping and bleeding onset today.  Pt a&o, skin w/d, casually sitting up cross legged on stretcher texting on cell phone.  NAD

## 2015-05-21 NOTE — ED Notes (Signed)
AAOx3.  Skin warm and dry.  NAD.  D/C home. Ambulates with easy and steady gait.

## 2015-05-21 NOTE — ED Notes (Signed)
Return from [place]. AAOx3.  Skin warm and dry.

## 2015-05-21 NOTE — ED Provider Notes (Signed)
[facility] Emergency Department Provider Note  ____________________________________________  Time seen: Approximately 8:30 PM  I have reviewed the triage vital signs and the nursing notes.   HISTORY  Chief Complaint Vaginal Bleeding    HPI Zoe Ramos is a 39 y.o. female G7 P5 AB 1 at approximately [redacted] weeks gestation who presents with gradual onset of vaginal bleeding with occasional mild cramping.  She states that yesterday she had some bleeding that seemed to only be with using the bathroom.  Today, however, she states that she has had more vaginal bleeding and has changed her pad twice today.  The blood is dark.  She has also had intermittent cramping that is mild but relatively brief.  She is not sure how frequently it happens.  She denies headache, chest pain, abdominal pain other than the cramps, nausea/vomiting, and dysuria.   Past Medical History  Diagnosis Date  . Anemia   . Mental disorder     bipolar  . Nausea and vomiting during pregnancy   . GERD (gastroesophageal reflux disease)     Patient Active Problem List   Diagnosis Date Noted  . AMA (advanced maternal age) multigravida 35+ 05/17/2015  . H/O preterm delivery, currently pregnant 05/17/2015    Past Surgical History  Procedure Laterality Date  . Hernia repair    . Gallbladder surgery      Current Outpatient Rx  Name  Route  Sig  Dispense  Refill  . Doxylamine-Pyridoxine (DICLEGIS) 10-10 MG TBEC   Oral   Take 2 tablets by mouth at bedtime. If symptoms persist, add one tablet in the morning and one in the afternoon Patient not taking: Reported on 05/17/2015   100 tablet   5   . nitrofurantoin (MACRODANTIN) 100 MG capsule   Oral   Take 1 capsule (100 mg total) by mouth 4 (four) times daily. Patient not taking: Reported on 04/19/2015   28 capsule   0   . Prenatal MV-Min-Fe Fum-FA-DHA (PRENATAL 1 PO)   Oral   Take by mouth 1 day or 1 dose.         . promethazine  (PHENERGAN) 25 MG tablet   Oral   Take 1 tablet (25 mg total) by mouth every 6 (six) hours as needed for nausea or vomiting.   30 tablet   2     Allergies Morphine and related  Family History  Problem Relation Age of Onset  . Cancer Mother     ovarian, breast  . Diabetes Father   . Diabetes Sister     Social History History  Substance Use Topics  . Smoking status: Current Every Day Smoker -- 5.00 packs/day for 20 years    Types: Cigarettes  . Smokeless tobacco: Never Used  . Alcohol Use: No    Review of Systems Constitutional: No fever/chills Eyes: No visual changes. ENT: No sore throat. Cardiovascular: Denies chest pain. Respiratory: Denies shortness of breath. Gastrointestinal: Mild pelvic cramping intermittently.  No nausea, no vomiting.  No diarrhea.  No constipation. Genitourinary: Negative for dysuria.  Vaginal bleeding during pregnancy Musculoskeletal: Negative for back pain. Skin: Negative for rash. Neurological: Negative for headaches, focal weakness or numbness.  10-point ROS otherwise negative.  ____________________________________________   PHYSICAL EXAM:  VITAL SIGNS: ED Triage Vitals  Enc Vitals Group     BP 05/21/15 1721 111/62 mmHg     Pulse Rate 05/21/15 1721 73     Resp 05/21/15 1721 18     Temp 05/21/15  1721 99.2 F (37.3 C)     Temp Source 05/21/15 1721 Oral     SpO2 05/21/15 1721 99 %     Weight --      Height --      Head Cir --      Peak Flow --      Pain Score 05/21/15 1716 4     Pain Loc --      Pain Edu? --      Excl. in [place]? --     Constitutional: Alert and oriented. Well appearing and in no acute distress. Eyes: Conjunctivae are normal. PERRL. EOMI. Head: Atraumatic. Nose: No congestion/rhinnorhea. Mouth/Throat: Mucous membranes are moist.  Oropharynx non-erythematous. Neck: No stridor.   Cardiovascular: Normal rate, regular rhythm. Grossly normal heart sounds.  Good peripheral circulation. Respiratory: Normal  respiratory effort.  No retractions. Lungs CTAB. Gastrointestinal: Soft and nontender. No distention. No abdominal bruits. No CVA tenderness. Genitourinary:  Deferred given the patient is in second trimester Musculoskeletal: No lower extremity tenderness nor edema.  No joint effusions. Neurologic:  Normal speech and language. No gross focal neurologic deficits are appreciated.  Skin:  Skin is warm, dry and intact. No rash noted. Psychiatric: Mood and affect are normal. Speech and behavior are normal.  Smiling and appropriate with me.  Texting continuously in the exam room, watching TV and in no acute distress.  ____________________________________________   LABS (all labs ordered are listed, but only abnormal results are displayed)  Labs Reviewed  CBC WITH DIFFERENTIAL/PLATELET - Abnormal; Notable for the following:    Hemoglobin 10.0 (*)    HCT 31.9 (*)    MCV 63.1 (*)    MCH 19.7 (*)    MCHC 31.2 (*)    RDW 15.4 (*)    All other components within normal limits  BASIC METABOLIC PANEL - Abnormal; Notable for the following:    Potassium 3.4 (*)    Calcium 8.4 (*)    All other components within normal limits  HCG, QUANTITATIVE, PREGNANCY - Abnormal; Notable for the following:    hCG, Beta Chain, [place], S 23615 (*)    All other components within normal limits  ABO/RH   ____________________________________________  EKG  Not indicated ____________________________________________  RADIOLOGY  US Ob Limited (>[redacted] Weeks Gestation)  05/21/2015   CLINICAL DATA:  Vaginal bleeding and cramping at 18 weeks ; assigned EGA [redacted] weeks 6 days  EXAM: LIMITED OBSTETRIC ULTRASOUND  FINDINGS: Number of Fetuses: 1  Heart Rate:  153 bpm  Movement: Yes  Presentation: Cephalic  Placental Location: Anterior  Previa: No  Amniotic Fluid (Subjective):  Within normal limits.  FL:  2.35 cm 17 w 0 d  MATERNAL FINDINGS:  Cervix:  Appears closed.  Uterus/Adnexae:  No abnormality visualized.  IMPRESSION: Single  live intrauterine gestation as above.  No acute abnormalities.  This exam is performed on an emergent basis and does not comprehensively evaluate fetal size, dating, or anatomy; follow-up complete OB US should be considered if further fetal assessment is warranted.   Electronically Signed   By: Lavonia Dana M.D.   On: 05/21/2015 21:07    ____________________________________________   PROCEDURES  Procedure(s) performed: None  Critical Care performed: No ____________________________________________   INITIAL IMPRESSION / ASSESSMENT AND PLAN / ED COURSE  Pertinent labs & imaging results that were available during my care of the patient were reviewed by me and considered in my medical decision making (see chart for details).  The patient's ultrasound is unremarkable.  There  were no acute findings visualized.  She is in no acute distress, has B+ blood and therefore does not need rub, and has follow-up at [facility].  Advised her to call them tomorrow and schedule the next available follow-up appointment.  I gave her my usual and customary return precautions.  She has been hemodynamically stable during approximately 4-1/2 hours in the emergency department.  ____________________________________________  FINAL CLINICAL IMPRESSION(S) / ED DIAGNOSES  Final diagnoses:  Threatened miscarriage      NEW MEDICATIONS STARTED DURING THIS VISIT:  New Prescriptions   No medications on file      Hinda Kehr, MD 05/21/15 2131

## 2015-05-21 NOTE — ED Notes (Signed)
Pt is 18 weeks preg with contractions and vaginal bleeding

## 2015-05-21 NOTE — Discharge Instructions (Signed)
Though we understand you have been having vaginal bleeding since yesterday, your ultrasound showed a single live fetus at approximately [redacted] weeks gestation today with no acute abnormalities visualized.  Please follow-up with your OB/GYN at the next available appointment.  Return to the emergency department if he develop any new or worsening symptoms that concern you.  Please mention to your [facility] doctor that you have be positive blood and therefore did not receive Rhogam today.   Threatened Miscarriage A threatened miscarriage is when you have vaginal bleeding during your first 20 weeks of pregnancy but the pregnancy has not ended. Your doctor will do tests to make sure you are still pregnant. The cause of the bleeding may not be known. This condition does not mean your pregnancy will end. It does increase the risk of it ending (complete miscarriage). HOME CARE   Make sure you keep all your doctor visits for prenatal care.  Get plenty of rest.  Do not have sex or use tampons if you have vaginal bleeding.  Do not douche.  Do not smoke or use drugs.  Do not drink alcohol.  Avoid caffeine. GET HELP IF:  You have light bleeding from your vagina.  You have belly pain or cramping.  You have a fever. GET HELP RIGHT AWAY IF:   You have heavy bleeding from your vagina.  You have clots of blood coming from your vagina.  You have bad pain or cramps in your low back or belly.  You have fever, chills, and bad belly pain. MAKE SURE YOU:   Understand these instructions.  Will watch your condition.  Will get help right away if you are not doing well or get worse. Document Released: 10/02/2008 Document Revised: 10/25/2013 Document Reviewed: 08/16/2013 [facility] Patient Information 2015 [place], [place]. This information is not intended to replace advice given to you by your health care provider. Make sure you discuss any questions you have with your health care provider.

## 2015-05-29 ENCOUNTER — Ambulatory Visit: Payer: Medicaid Other

## 2015-05-29 DIAGNOSIS — Z3492 Encounter for supervision of normal pregnancy, unspecified, second trimester: Secondary | ICD-10-CM

## 2015-06-07 ENCOUNTER — Ambulatory Visit (INDEPENDENT_AMBULATORY_CARE_PROVIDER_SITE_OTHER): Payer: Medicaid Other | Admitting: Obstetrics and Gynecology

## 2015-06-07 ENCOUNTER — Encounter: Payer: Self-pay | Admitting: Obstetrics and Gynecology

## 2015-06-07 VITALS — BP 102/70 | HR 84 | Wt 208.0 lb

## 2015-06-07 DIAGNOSIS — Z3492 Encounter for supervision of normal pregnancy, unspecified, second trimester: Secondary | ICD-10-CM

## 2015-06-07 LAB — POCT URINALYSIS DIPSTICK
Glucose, UA: NEGATIVE
Ketones, UA: NEGATIVE
Nitrite, UA: NEGATIVE
Spec Grav, UA: 1.015
Urobilinogen, UA: 0.2
pH, UA: 6.5

## 2015-06-07 MED ORDER — METRONIDAZOLE 500 MG PO TABS: 500.0000 mg | ORAL_TABLET | Freq: Two times a day (BID) | ORAL | Status: AC

## 2015-06-07 MED ORDER — BENZOCAINE-RESORCINOL 5-2 % VA CREA: TOPICAL_CREAM | Freq: Every day | VAGINAL | Status: DC

## 2015-06-07 NOTE — Progress Notes (Signed)
Pt is here for OB work in, c/o vaginal itching, vaginal d/c

## 2015-06-07 NOTE — Progress Notes (Signed)
Problem OB- vulvar red and excoriated with vaginal vault red and copious green d/c Microscopic wet-mount exam shows excessive bacteria, trichomonads, white blood cells. Discussed trich and treatment with TOC need in 1 month. Aldo can use vagisil prn.

## 2015-06-07 NOTE — Patient Instructions (Signed)

## 2015-06-14 ENCOUNTER — Ambulatory Visit (INDEPENDENT_AMBULATORY_CARE_PROVIDER_SITE_OTHER): Payer: Medicaid Other | Admitting: Obstetrics and Gynecology

## 2015-06-14 ENCOUNTER — Encounter: Payer: Self-pay | Admitting: Obstetrics and Gynecology

## 2015-06-14 VITALS — BP 104/60 | HR 81 | Wt 207.7 lb

## 2015-06-14 DIAGNOSIS — Z3492 Encounter for supervision of normal pregnancy, unspecified, second trimester: Secondary | ICD-10-CM

## 2015-06-14 LAB — POCT URINALYSIS DIPSTICK
Blood, UA: NEGATIVE
Glucose, UA: NEGATIVE
Ketones, UA: NEGATIVE
Leukocytes, UA: NEGATIVE
Nitrite, UA: NEGATIVE
Protein, UA: 6.5
Spec Grav, UA: 1.015
Urobilinogen, UA: 0.2
pH, UA: 7.5

## 2015-06-14 NOTE — Progress Notes (Signed)
ROB- denies any new complaints, denies any vaginal sx

## 2015-06-14 NOTE — Progress Notes (Signed)
ROB- reviewed u/s: Indications:Anatomy U/S Findings:  Singleton intrauterine pregnancy is visualized with FHR at 155 BPM. Biometrics give an (U/S) Gestational age of 40 1/7 weeks and an (U/S) EDD of 06/29/2015; this correlates with the clinically established EDD of 06/29/2015 by early [place] at 6 1/7 weeks.  Fetal presentation is Breech.  EFW: 229 g, 8 oz, 49%. Placenta: anterior and remote from the cervix. AFI: appears adequate.  Anatomic survey is incomplete and normal; Gender - female . Limited due to fetal position and earlier gestational age.  Heart views, profile, open hand and 5th were not obtained on today's anatomy scan.  Survey of the adnexa demonstrates no adnexal masses. There is no free peritoneal fluid in the cul de sac.  Impression: 1. 18 1/7 week Viable Singleton Intrauterine pregnancy by U/S. 2. (U/S) EDD is consistent with Clinically established (LMP) EDD of 06/29/2015. 3. Incomplete Anatomy Scan  Recommendations: 1.Clinical correlation with the patient's History and Physical Exam. 2. Follow up anatomy scan in 2-4 weeks.

## 2015-07-04 ENCOUNTER — Other Ambulatory Visit: Payer: Medicaid Other

## 2015-07-04 ENCOUNTER — Ambulatory Visit (INDEPENDENT_AMBULATORY_CARE_PROVIDER_SITE_OTHER): Payer: Medicaid Other | Admitting: Obstetrics and Gynecology

## 2015-07-04 ENCOUNTER — Encounter: Payer: Self-pay | Admitting: Obstetrics and Gynecology

## 2015-07-04 ENCOUNTER — Ambulatory Visit: Payer: Medicaid Other

## 2015-07-04 VITALS — BP 108/75 | HR 81 | Wt 209.8 lb

## 2015-07-04 DIAGNOSIS — Z331 Pregnant state, incidental: Secondary | ICD-10-CM

## 2015-07-04 DIAGNOSIS — Z3492 Encounter for supervision of normal pregnancy, unspecified, second trimester: Secondary | ICD-10-CM | POA: Diagnosis not present

## 2015-07-04 LAB — POCT URINALYSIS DIPSTICK
Bilirubin, UA: NEGATIVE
Blood, UA: NEGATIVE
Glucose, UA: NEGATIVE
Ketones, UA: NEGATIVE
Leukocytes, UA: NEGATIVE
Nitrite, UA: NEGATIVE
Protein, UA: NEGATIVE
Spec Grav, UA: 1.015
Urobilinogen, UA: 0.2
pH, UA: 7

## 2015-07-04 NOTE — Progress Notes (Signed)
Anatomy scan now complete- TOC obtained, patient desires Nicotrol patch to stop smoking- now at 1 ppd- counseled on nicotine exposure in pregnancy and risk- she feels like less exposure with patch and it will help her quit, so is willing to try.

## 2015-07-04 NOTE — Progress Notes (Signed)
ROB-no new complaints, pelvic pressure

## 2015-07-06 ENCOUNTER — Observation Stay
Admission: EM | Admit: 2015-07-06 | Discharge: 2015-07-06 | Disposition: A | Discharge status: Home / Self Care | Payer: Medicaid Other | Attending: Obstetrics and Gynecology | Admitting: Obstetrics and Gynecology

## 2015-07-06 ENCOUNTER — Encounter: Payer: Self-pay | Admitting: *Deleted

## 2015-07-06 DIAGNOSIS — Z885 Allergy status to narcotic agent status: Secondary | ICD-10-CM | POA: Diagnosis not present

## 2015-07-06 DIAGNOSIS — Z888 Allergy status to other drugs, medicaments and biological substances status: Secondary | ICD-10-CM | POA: Diagnosis not present

## 2015-07-06 DIAGNOSIS — Z3A23 23 weeks gestation of pregnancy: Secondary | ICD-10-CM | POA: Insufficient documentation

## 2015-07-06 DIAGNOSIS — N9489 Other specified conditions associated with female genital organs and menstrual cycle: Secondary | ICD-10-CM | POA: Diagnosis not present

## 2015-07-06 DIAGNOSIS — O26892 Other specified pregnancy related conditions, second trimester: Secondary | ICD-10-CM | POA: Diagnosis not present

## 2015-07-06 DIAGNOSIS — O09529 Supervision of elderly multigravida, unspecified trimester: Secondary | ICD-10-CM

## 2015-07-06 DIAGNOSIS — R102 Pelvic and perineal pain: Secondary | ICD-10-CM | POA: Diagnosis present

## 2015-07-06 HISTORY — DX: Personal history of other diseases of the digestive system: Z87.19

## 2015-07-06 LAB — URINALYSIS COMPLETE WITH MICROSCOPIC (ARMC ONLY)
Bilirubin Urine: NEGATIVE
Glucose, UA: NEGATIVE mg/dL
Hgb urine dipstick: NEGATIVE
Ketones, ur: NEGATIVE mg/dL
Nitrite: NEGATIVE
Protein, ur: 30 mg/dL — AB
Specific Gravity, Urine: 1.024 (ref 1.005–1.030)
pH: 6 (ref 5.0–8.0)

## 2015-07-06 LAB — URINE DRUG SCREEN, QUALITATIVE (ARMC ONLY)
Amphetamines, Ur Screen: NOT DETECTED
Barbiturates, Ur Screen: NOT DETECTED
Benzodiazepine, Ur Scrn: NOT DETECTED
Cannabinoid 50 Ng, Ur ~~LOC~~: NOT DETECTED
Cocaine Metabolite,Ur ~~LOC~~: NOT DETECTED
MDMA (Ecstasy)Ur Screen: NOT DETECTED
Methadone Scn, Ur: NOT DETECTED
Opiate, Ur Screen: NOT DETECTED
Phencyclidine (PCP) Ur S: NOT DETECTED
Tricyclic, Ur Screen: NOT DETECTED

## 2015-07-06 NOTE — Progress Notes (Signed)
Went into patient's room to give discharge papers, patient found sitting up complaining of more abdominal pain. Dr. Enzo Bi notified, to come see patient.

## 2015-07-06 NOTE — OB Triage Note (Signed)
"  Last four days I've been having pressure when I walk"

## 2015-07-06 NOTE — OB Triage Note (Signed)
Called [facility] last night and talked to the triage nurse "she told me to call 911, but I fell asleep."

## 2015-07-06 NOTE — Progress Notes (Addendum)
CC: 1. 23.4 week IUP with pelvic pressure  Review of Systems  Constitutional: Negative for fever and chills.  Gastrointestinal: Positive for abdominal pain. Negative for nausea, vomiting, diarrhea and constipation.  Genitourinary: Negative for dysuria, urgency, frequency and flank pain.  Musculoskeletal: Negative for back pain and falls.  Skin: Negative for itching and rash.   PE: Atypical affect Back without CVAT Abd: soft; ut non tender FHR normal Cervix not checked NST INTERPRETATION: Indications: pelvic pressure  Mode: External Baseline Rate (A): 145 bpm Variability: Moderate Accelerations: 10 x 10 Decelerations: Variable     Contraction Frequency (min): 0   Impression: reactive, isolated variable deceleration x 1; overall reassuring.   Plan:  1. Keep Reg OB appt as scheduled 2. UA with Culture 3. UDS    Brayton Mars, MD

## 2015-07-06 NOTE — Discharge Instructions (Signed)
Call your doctor for any worsening pain, vaginal bleeding, decrease in baby's movement, leaking fluid, or any other concerns.

## 2015-07-06 NOTE — Discharge Summary (Signed)
Reviewed discharge instructions with patient and significant other, including reasons to return to ER. Verbalized understanding, copy of instructions given to patient. Stable and ambulatory on discharge.

## 2015-07-08 LAB — URINE CULTURE: Culture: 10000

## 2015-07-09 LAB — CT NG TV HSV BY NAA
Chlamydia by NAA: NEGATIVE
Gonococcus by NAA: NEGATIVE

## 2015-07-10 ENCOUNTER — Telehealth: Payer: Self-pay

## 2015-07-10 MED ORDER — AMOXICILLIN 500 MG PO CAPS: 500.0000 mg | ORAL_CAPSULE | Freq: Two times a day (BID) | ORAL | Status: DC

## 2015-07-10 NOTE — Telephone Encounter (Signed)
-----   Message from Brayton Mars, MD sent at 07/08/2015  4:57 PM EDT ----- Please notify - Abnormal Labs Urine culture positive for GBS.  Call amoxicillin 500 mg twice a day 7 days

## 2015-07-10 NOTE — Telephone Encounter (Signed)
Pt aware. Med erx. 

## 2015-07-18 ENCOUNTER — Observation Stay
Admission: EM | Admit: 2015-07-18 | Discharge: 2015-07-19 | Disposition: A | Discharge status: Home / Self Care | Payer: Medicaid Other | Attending: Obstetrics and Gynecology | Admitting: Obstetrics and Gynecology

## 2015-07-18 ENCOUNTER — Encounter: Payer: Medicaid Other | Admitting: Obstetrics and Gynecology

## 2015-07-18 ENCOUNTER — Encounter: Payer: Self-pay | Admitting: *Deleted

## 2015-07-18 DIAGNOSIS — Z3A25 25 weeks gestation of pregnancy: Secondary | ICD-10-CM | POA: Diagnosis not present

## 2015-07-18 DIAGNOSIS — R51 Headache: Secondary | ICD-10-CM | POA: Diagnosis present

## 2015-07-18 DIAGNOSIS — O26892 Other specified pregnancy related conditions, second trimester: Principal | ICD-10-CM | POA: Insufficient documentation

## 2015-07-18 MED ORDER — ACETAMINOPHEN 500 MG PO TABS
ORAL_TABLET | ORAL | Status: AC
  Administered 2015-07-18: 500 mg via ORAL
  Filled 2015-07-18: qty 1

## 2015-07-18 MED ORDER — ACETAMINOPHEN 500 MG PO TABS
500.0000 mg | ORAL_TABLET | ORAL | Status: DC | PRN
  Administered 2015-07-18: 500 mg via ORAL

## 2015-07-19 DIAGNOSIS — O26892 Other specified pregnancy related conditions, second trimester: Secondary | ICD-10-CM | POA: Diagnosis not present

## 2015-07-19 LAB — URINALYSIS COMPLETE WITH MICROSCOPIC (ARMC ONLY)
Bilirubin Urine: NEGATIVE
Glucose, UA: NEGATIVE mg/dL
Hgb urine dipstick: NEGATIVE
Ketones, ur: NEGATIVE mg/dL
Nitrite: NEGATIVE
Protein, ur: NEGATIVE mg/dL
RBC / HPF: NONE SEEN RBC/hpf (ref 0–5)
Specific Gravity, Urine: 1.005 (ref 1.005–1.030)
pH: 7 (ref 5.0–8.0)

## 2015-07-26 ENCOUNTER — Encounter: Payer: Self-pay | Admitting: Obstetrics and Gynecology

## 2015-07-26 ENCOUNTER — Ambulatory Visit (INDEPENDENT_AMBULATORY_CARE_PROVIDER_SITE_OTHER): Payer: Medicaid Other | Admitting: Obstetrics and Gynecology

## 2015-07-26 VITALS — BP 110/72 | HR 84 | Wt 213.8 lb

## 2015-07-26 DIAGNOSIS — Z3493 Encounter for supervision of normal pregnancy, unspecified, third trimester: Secondary | ICD-10-CM | POA: Diagnosis not present

## 2015-07-26 LAB — POCT URINALYSIS DIPSTICK
Bilirubin, UA: NEGATIVE
Blood, UA: NEGATIVE
Glucose, UA: NEGATIVE
Ketones, UA: 5
Leukocytes, UA: NEGATIVE
Nitrite, UA: NEGATIVE
Spec Grav, UA: 1.015
Urobilinogen, UA: 0.2
pH, UA: 6.5

## 2015-07-26 MED ORDER — INFLUENZA VAC SPLIT QUAD 0.5 ML IM SUSY
0.5000 mL | PREFILLED_SYRINGE | Freq: Once | INTRAMUSCULAR | Status: AC
  Administered 2015-07-26: 0.5 mL via INTRAMUSCULAR

## 2015-07-26 NOTE — Progress Notes (Signed)
ROB- doing better; desires BTL PP- counseled and medicaid papers signed today. Flu Vccine given; FOB no longer involved, but has good support with family

## 2015-07-26 NOTE — Progress Notes (Signed)
ROB-c/o pelvic pressure, still having some nauesa

## 2015-07-26 NOTE — Patient Instructions (Signed)
Laparoscopic Tubal Ligation Laparoscopic tubal ligation is a procedure that closes the fallopian tubes at a time other than right after childbirth. By closing the fallopian tubes, the eggs that are released from the ovaries cannot enter the uterus and sperm cannot reach the egg. Tubal ligation is also known as getting your "tubes tied." Tubal ligation is done so you will not be able to get pregnant or have a baby.  Although this procedure may be reversed, it should be considered permanent and irreversible. If you want to have future pregnancies, you should not have this procedure.  LET YOUR CAREGIVER KNOW ABOUT:  Allergies to food or medicine.  Medicines taken, including vitamins, herbs, eyedrops, over-the-counter medicines, and creams.  Use of steroids (by mouth or creams).  Previous problems with numbing medicines.  History of bleeding problems or blood clots.  Any recent colds or infections.  Previous surgery.  Other health problems, including diabetes and kidney problems.  Possibility of pregnancy, if this applies.  Any past pregnancies. RISKS AND COMPLICATIONS   Infection.  Bleeding.  Injury to surrounding organs.  Anesthetic side effects.  Failure of the procedure.  Ectopic pregnancy.  Future regret about having the procedure done. BEFORE THE PROCEDURE  Do not take aspirin or blood thinners a week before the procedure or as directed. This can cause bleeding.  Do not eat or drink anything 6 to 8 hours before the procedure. PROCEDURE   You may be given a medicine to help you relax (sedative) before the procedure. You will be given a medicine to make you sleep (general anesthetic) during the procedure.  A tube will be put down your throat to help your breath while under general anesthesia.  Two small cuts (incisions) are made in the lower abdominal area and near the belly button.  Your abdominal area will be inflated with a safe gas (carbon dioxide). This helps  give the surgeon room to operate, visualize, and helps the surgeon avoid other organs.  A thin, lighted tube (laparoscope) with a camera attached is inserted into your abdomen through one of the incisions near the belly button. Other small instruments are also inserted through the other abdominal incision.  The fallopian tubes are located and are either blocked with a ring, clip, or are burned (cauterized).  After the fallopian tubes are blocked, the gas is released from the abdomen.  The incisions will be closed with stitches (sutures), and a bandage may be placed over the incisions. AFTER THE PROCEDURE   You will rest in a recovery room for 1--4 hours until you are stable and doing well.  You will also have some mild abdominal discomfort for 3--7 days. You will be given pain medicine to ease any discomfort.  As long as there are no problems, you may be allowed to go home. Someone will need to drive you home and be with you for at least 24 hours once home.  You may have some mild discomfort in the throat. This is from the tube placed in your throat while you were sleeping.  You may experience discomfort in the shoulder area from some trapped air between the liver and diaphragm. This sensation is normal and will slowly go away on its own. Document Released: 01/26/2001 Document Revised: 04/20/2012 Document Reviewed: 01/31/2012 [employer] Patient Information 2015 [employer]. This information is not intended to replace advice given to you by your health care provider. Make sure you discuss any questions you have with your health care provider.  

## 2015-08-01 ENCOUNTER — Encounter: Payer: Medicaid Other | Admitting: Obstetrics and Gynecology

## 2015-08-09 ENCOUNTER — Encounter: Payer: Self-pay | Admitting: Obstetrics and Gynecology

## 2015-08-09 ENCOUNTER — Encounter: Payer: Medicaid Other | Admitting: Obstetrics and Gynecology

## 2015-08-09 ENCOUNTER — Ambulatory Visit (INDEPENDENT_AMBULATORY_CARE_PROVIDER_SITE_OTHER): Payer: Medicaid Other | Admitting: Obstetrics and Gynecology

## 2015-08-09 VITALS — BP 127/77 | HR 86 | Wt 214.8 lb

## 2015-08-09 DIAGNOSIS — F315 Bipolar disorder, current episode depressed, severe, with psychotic features: Secondary | ICD-10-CM

## 2015-08-09 DIAGNOSIS — Z23 Encounter for immunization: Secondary | ICD-10-CM | POA: Diagnosis not present

## 2015-08-09 DIAGNOSIS — Z3493 Encounter for supervision of normal pregnancy, unspecified, third trimester: Secondary | ICD-10-CM | POA: Diagnosis not present

## 2015-08-09 DIAGNOSIS — F258 Other schizoaffective disorders: Secondary | ICD-10-CM

## 2015-08-09 DIAGNOSIS — F259 Schizoaffective disorder, unspecified: Secondary | ICD-10-CM

## 2015-08-09 DIAGNOSIS — Z131 Encounter for screening for diabetes mellitus: Secondary | ICD-10-CM

## 2015-08-09 DIAGNOSIS — F3130 Bipolar disorder, current episode depressed, mild or moderate severity, unspecified: Secondary | ICD-10-CM | POA: Insufficient documentation

## 2015-08-09 LAB — POCT URINALYSIS DIPSTICK
Blood, UA: NEGATIVE
Glucose, UA: NEGATIVE
Ketones, UA: NEGATIVE
Nitrite, UA: NEGATIVE
Spec Grav, UA: 1.015
Urobilinogen, UA: 0.2
pH, UA: 7.5

## 2015-08-09 MED ORDER — TETANUS-DIPHTH-ACELL PERTUSSIS 5-2.5-18.5 LF-MCG/0.5 IM SUSP
0.5000 mL | Freq: Once | INTRAMUSCULAR | Status: AC
  Administered 2015-08-09: 0.5 mL via INTRAMUSCULAR

## 2015-08-09 MED ORDER — SERTRALINE HCL 50 MG PO TABS: 50.0000 mg | ORAL_TABLET | Freq: Every day | ORAL | Status: DC

## 2015-08-09 NOTE — Progress Notes (Signed)
ROB- Tdap given, blood consent signed, cord blood donation info given; glucola done; discussed restart of depression medication- Rx for zoloft 50 sent in, she understands the Haldol is contraindicated in pregnancy and we will restart it after delivery.

## 2015-08-09 NOTE — Progress Notes (Signed)
ROB-lots of pelvic pressure x 4 days, some vaginal d/c, pt also states she is battling with depression, bipolar disease, request medication glucola today, tdap given,blood consent signed

## 2015-08-10 LAB — HEMOGLOBIN AND HEMATOCRIT, BLOOD
Hematocrit: 30.4 % — ABNORMAL LOW (ref 34.0–46.6)
Hemoglobin: 10 g/dL — ABNORMAL LOW (ref 11.1–15.9)

## 2015-08-10 LAB — GLUCOSE, 1 HOUR GESTATIONAL: Gestational Diabetes Screen: 121 mg/dL (ref 65–139)

## 2015-08-13 ENCOUNTER — Telehealth: Payer: Self-pay | Admitting: *Deleted

## 2015-08-13 NOTE — Telephone Encounter (Signed)
Notified pt of results, she will stop by and pick up iron samples

## 2015-08-13 NOTE — Telephone Encounter (Signed)
-----   Message from Evonnie Pat, [place] sent at 08/10/2015  4:49 PM EDT ----- Please let her know she passed her glucola, but is still anemic- no increase since first labs- make sure she is taking iron supplements and see if she needs RX

## 2015-08-27 ENCOUNTER — Other Ambulatory Visit: Payer: Self-pay | Admitting: *Deleted

## 2015-08-27 MED ORDER — BUTALBITAL-ACETAMINOPHEN 50-300 MG PO TABS: 1.0000 | ORAL_TABLET | Freq: Two times a day (BID) | ORAL | Status: DC

## 2015-08-29 ENCOUNTER — Ambulatory Visit (INDEPENDENT_AMBULATORY_CARE_PROVIDER_SITE_OTHER): Payer: Medicaid Other | Admitting: Obstetrics and Gynecology

## 2015-08-29 ENCOUNTER — Encounter: Payer: Self-pay | Admitting: Obstetrics and Gynecology

## 2015-08-29 VITALS — BP 91/58 | HR 85 | Wt 208.0 lb

## 2015-08-29 DIAGNOSIS — Z3493 Encounter for supervision of normal pregnancy, unspecified, third trimester: Secondary | ICD-10-CM

## 2015-08-29 LAB — POCT URINALYSIS DIPSTICK
Bilirubin, UA: NEGATIVE
Blood, UA: NEGATIVE
Glucose, UA: NEGATIVE
Ketones, UA: NEGATIVE
Leukocytes, UA: NEGATIVE
Nitrite, UA: NEGATIVE
Spec Grav, UA: 1.015
Urobilinogen, UA: 0.2
pH, UA: 6

## 2015-08-29 NOTE — Progress Notes (Signed)
ROB- PNV samples given, instructed to try yo increase small freq. Protein snacks

## 2015-08-29 NOTE — Progress Notes (Signed)
ROB- pt states she has no appetite, she is having headaches, still having pelvic pressure

## 2015-09-06 ENCOUNTER — Telehealth: Payer: Self-pay | Admitting: Obstetrics and Gynecology

## 2015-09-06 NOTE — Telephone Encounter (Signed)
Advised pt she can take medication it was ok,she voiced understanding

## 2015-09-06 NOTE — Telephone Encounter (Signed)
PT CALLED AND SHE STATED THAT THE PHARMACIST TOLD HER THAT SHE CAN NOT TAKE THE HEADACHE MEDICINE THAT WAS CALLED IN FOR HER SO SHE HAS NOT BEEN TAKING IT AND SHE IS HAVING REALLY BAD HEADACHES AND NEEDS SOMETHING.

## 2015-09-07 ENCOUNTER — Observation Stay
Admission: EM | Admit: 2015-09-07 | Discharge: 2015-09-07 | Disposition: A | Discharge status: Home / Self Care | Payer: Medicaid Other | Attending: Obstetrics and Gynecology | Admitting: Obstetrics and Gynecology

## 2015-09-07 DIAGNOSIS — Z3A32 32 weeks gestation of pregnancy: Secondary | ICD-10-CM | POA: Diagnosis not present

## 2015-09-07 DIAGNOSIS — O4290 Premature rupture of membranes, unspecified as to length of time between rupture and onset of labor, unspecified weeks of gestation: Secondary | ICD-10-CM

## 2015-09-07 DIAGNOSIS — O09523 Supervision of elderly multigravida, third trimester: Secondary | ICD-10-CM | POA: Insufficient documentation

## 2015-09-07 NOTE — Discharge Instructions (Signed)
Braxton Hicks Contractions °Contractions of the uterus can occur throughout pregnancy. Contractions are not always a sign that you are in labor.  °WHAT ARE BRAXTON HICKS CONTRACTIONS?  °Contractions that occur before labor are called Braxton Hicks contractions, or false labor. Toward the end of pregnancy (32-34 weeks), these contractions can develop more often and may become more forceful. This is not true labor because these contractions do not result in opening (dilatation) and thinning of the cervix. They are sometimes difficult to tell apart from true labor because these contractions can be forceful and people have different pain tolerances. You should not feel embarrassed if you go to the hospital with false labor. Sometimes, the only way to tell if you are in true labor is for your health care provider to look for changes in the cervix. °If there are no prenatal problems or other health problems associated with the pregnancy, it is completely safe to be sent home with false labor and await the onset of true labor. °HOW CAN YOU TELL THE DIFFERENCE BETWEEN TRUE AND FALSE LABOR? °False Labor °· The contractions of false labor are usually shorter and not as hard as those of true labor.   °· The contractions are usually irregular.   °· The contractions are often felt in the front of the lower abdomen and in the groin.   °· The contractions may go away when you walk around or change positions while lying down.   °· The contractions get weaker and are shorter lasting as time goes on.   °· The contractions do not usually become progressively stronger, regular, and closer together as with true labor.   °True Labor °· Contractions in true labor last 30-70 seconds, become very regular, usually become more intense, and increase in frequency.   °· The contractions do not go away with walking.   °· The discomfort is usually felt in the top of the uterus and spreads to the lower abdomen and low back.   °· True labor can be  determined by your health care provider with an exam. This will show that the cervix is dilating and getting thinner.   °WHAT TO REMEMBER °· Keep up with your usual exercises and follow other instructions given by your health care provider.   °· Take medicines as directed by your health care provider.   °· Keep your regular prenatal appointments.   °· Eat and drink lightly if you think you are going into labor.   °· If Braxton Hicks contractions are making you uncomfortable:   °¨ Change your position from lying down or resting to walking, or from walking to resting.   °¨ Sit and rest in a tub of warm water.   °¨ Drink 2-3 glasses of water. Dehydration may cause these contractions.   °¨ Do slow and deep breathing several times an hour.   °WHEN SHOULD I SEEK IMMEDIATE MEDICAL CARE? °Seek immediate medical care if: °· Your contractions become stronger, more regular, and closer together.   °· You have fluid leaking or gushing from your vagina.   °· You have a fever.   °· You pass blood-tinged mucus.   °· You have vaginal bleeding.   °· You have continuous abdominal pain.   °· You have low back pain that you never had before.   °· You feel your baby's head pushing down and causing pelvic pressure.   °· Your baby is not moving as much as it used to.   °  °This information is not intended to replace advice given to you by your health care provider. Make sure you discuss any questions you have with your health care   provider. °  °Document Released: 10/20/2005 Document Revised: 10/25/2013 Document Reviewed: 08/01/2013 °Elsevier Interactive Patient Education ©2016 [employer] ° °

## 2015-09-07 NOTE — Discharge Summary (Addendum)
L&D OB Triage Note  SUBJECTIVE Zoe Ramos is a 39 y.o. [phone] female at [redacted]w[redacted]d, EDD Estimated Date of Delivery: 10/29/15 who presented to triage with complaints of Leaking fluid.    OBJECTIVE Nursing Evaluation: BP 113/83 mmHg  Pulse 109  Temp(Src) 98.7 F (37.1 C) (Oral)  Resp 18  LMP 01/16/2015 (Exact Date) /findings significant for Nitrazine negative vaginal secretions.  NST was performed and has been reviewed by me.  NST INTERPRETATION: Indications: Ruptured membranes  Mode: External Baseline Rate (A): 135 bpm Variability: Moderate Accelerations: 15 x 15 Decelerations: None     Contraction Frequency (min): none  ASSESSMENT Impression:  1. Pregnancy:  [id number] at [redacted]w[redacted]d , EDD Estimated Date of Delivery: 10/29/15 2.  Reactive NST 3. No ROM  PLAN 1. Reassurance given 2. Discharge home with bleeding/labor precautions.  3. Continue routine prenatal care.   Brayton Mars, MD

## 2015-09-10 ENCOUNTER — Telehealth: Payer: Self-pay | Admitting: Obstetrics and Gynecology

## 2015-09-10 NOTE — Telephone Encounter (Signed)
Patient called requesting a refill on phenergan.Thanks

## 2015-09-11 ENCOUNTER — Other Ambulatory Visit: Payer: Self-pay | Admitting: *Deleted

## 2015-09-11 MED ORDER — PROMETHAZINE HCL 25 MG PO TABS: 25.0000 mg | ORAL_TABLET | Freq: Four times a day (QID) | ORAL | Status: DC | PRN

## 2015-09-11 NOTE — Telephone Encounter (Signed)
Done-ac 

## 2015-09-13 ENCOUNTER — Encounter: Payer: Self-pay | Admitting: Obstetrics and Gynecology

## 2015-09-13 ENCOUNTER — Ambulatory Visit (INDEPENDENT_AMBULATORY_CARE_PROVIDER_SITE_OTHER): Payer: Medicaid Other | Admitting: Obstetrics and Gynecology

## 2015-09-13 VITALS — BP 100/65 | HR 89 | Wt 213.1 lb

## 2015-09-13 DIAGNOSIS — Z3493 Encounter for supervision of normal pregnancy, unspecified, third trimester: Secondary | ICD-10-CM

## 2015-09-13 LAB — POCT URINALYSIS DIPSTICK
Bilirubin, UA: NEGATIVE
Blood, UA: NEGATIVE
Glucose, UA: NEGATIVE
Ketones, UA: NEGATIVE
Nitrite, UA: NEGATIVE
Protein, UA: NEGATIVE
Spec Grav, UA: 1.01
Urobilinogen, UA: 0.2
pH, UA: 6.5

## 2015-09-13 NOTE — Progress Notes (Signed)
ROB- doing well, normal pregnancy changes reiterated, Iron samples given

## 2015-09-13 NOTE — Progress Notes (Signed)
ROB-pt it itching chest, arms seems to be getting worse, pelvic pressure Per pt her baby is breech and she would like to know what the plan is??

## 2015-09-26 ENCOUNTER — Ambulatory Visit (INDEPENDENT_AMBULATORY_CARE_PROVIDER_SITE_OTHER): Payer: Medicaid Other | Admitting: Obstetrics and Gynecology

## 2015-09-26 VITALS — BP 109/84 | HR 88 | Wt 213.9 lb

## 2015-09-26 DIAGNOSIS — Z331 Pregnant state, incidental: Secondary | ICD-10-CM

## 2015-09-26 DIAGNOSIS — Z3490 Encounter for supervision of normal pregnancy, unspecified, unspecified trimester: Secondary | ICD-10-CM

## 2015-09-26 LAB — POCT URINALYSIS DIPSTICK
Bilirubin, UA: NEGATIVE
Blood, UA: NEGATIVE
Glucose, UA: NEGATIVE
Ketones, UA: NEGATIVE
Leukocytes, UA: NEGATIVE
Nitrite, UA: NEGATIVE
Protein, UA: NEGATIVE
Spec Grav, UA: 1.015
Urobilinogen, UA: 0.2
pH, UA: 5

## 2015-09-26 NOTE — Addendum Note (Signed)
Addended by: Docia Furl on: 09/26/2015 03:48 PM   Modules accepted: Orders

## 2015-09-26 NOTE — Progress Notes (Signed)
ROB- doing well having some contractions

## 2015-09-26 NOTE — Progress Notes (Signed)
ROB- PTL precautions discussed, cultures obtained

## 2015-09-27 ENCOUNTER — Observation Stay
Admission: EM | Admit: 2015-09-27 | Discharge: 2015-09-27 | Disposition: A | Discharge status: Home / Self Care | Payer: Medicaid Other | Source: Home / Self Care | Admitting: Obstetrics and Gynecology

## 2015-09-27 DIAGNOSIS — M545 Low back pain: Secondary | ICD-10-CM

## 2015-09-27 DIAGNOSIS — O26893 Other specified pregnancy related conditions, third trimester: Secondary | ICD-10-CM

## 2015-09-27 DIAGNOSIS — Z3A35 35 weeks gestation of pregnancy: Secondary | ICD-10-CM | POA: Insufficient documentation

## 2015-09-27 DIAGNOSIS — Z3490 Encounter for supervision of normal pregnancy, unspecified, unspecified trimester: Secondary | ICD-10-CM

## 2015-09-27 LAB — GC/CHLAMYDIA PROBE AMP
Chlamydia trachomatis, NAA: NEGATIVE
Neisseria gonorrhoeae by PCR: NEGATIVE

## 2015-09-27 MED ORDER — ACETAMINOPHEN 325 MG PO TABS: 650.0000 mg | ORAL_TABLET | ORAL | Status: DC | PRN

## 2015-09-27 NOTE — OB Triage Note (Signed)
Pt came up via ED. Pt oriented to LDR 3 for obs of lower back pain and possible contractions.

## 2015-09-28 ENCOUNTER — Inpatient Hospital Stay
Admission: EM | Admit: 2015-09-28 | Discharge: 2015-10-01 | DRG: 767 | Disposition: A | Discharge status: Home / Self Care | Payer: Medicaid Other | Attending: Obstetrics and Gynecology | Admitting: Obstetrics and Gynecology

## 2015-09-28 DIAGNOSIS — Z3A35 35 weeks gestation of pregnancy: Secondary | ICD-10-CM | POA: Diagnosis not present

## 2015-09-28 DIAGNOSIS — Z833 Family history of diabetes mellitus: Secondary | ICD-10-CM | POA: Diagnosis not present

## 2015-09-28 DIAGNOSIS — O9902 Anemia complicating childbirth: Secondary | ICD-10-CM | POA: Diagnosis present

## 2015-09-28 DIAGNOSIS — Z302 Encounter for sterilization: Secondary | ICD-10-CM

## 2015-09-28 DIAGNOSIS — O99334 Smoking (tobacco) complicating childbirth: Secondary | ICD-10-CM | POA: Diagnosis present

## 2015-09-28 DIAGNOSIS — F258 Other schizoaffective disorders: Secondary | ICD-10-CM | POA: Diagnosis present

## 2015-09-28 DIAGNOSIS — O99344 Other mental disorders complicating childbirth: Secondary | ICD-10-CM | POA: Diagnosis present

## 2015-09-28 DIAGNOSIS — O9962 Diseases of the digestive system complicating childbirth: Secondary | ICD-10-CM | POA: Diagnosis present

## 2015-09-28 DIAGNOSIS — K219 Gastro-esophageal reflux disease without esophagitis: Secondary | ICD-10-CM | POA: Diagnosis present

## 2015-09-28 DIAGNOSIS — O42013 Preterm premature rupture of membranes, onset of labor within 24 hours of rupture, third trimester: Principal | ICD-10-CM | POA: Diagnosis present

## 2015-09-28 DIAGNOSIS — F3189 Other bipolar disorder: Secondary | ICD-10-CM | POA: Diagnosis present

## 2015-09-28 HISTORY — DX: Personal history of other infectious and parasitic diseases: Z86.19

## 2015-09-28 LAB — TYPE AND SCREEN
ABO/RH(D): B POS
Antibody Screen: NEGATIVE

## 2015-09-28 LAB — ABO/RH: ABO/RH(D): B POS

## 2015-09-28 LAB — CBC
HCT: 34 % — ABNORMAL LOW (ref 35.0–47.0)
Hemoglobin: 10.9 g/dL — ABNORMAL LOW (ref 12.0–16.0)
MCH: 20.7 pg — ABNORMAL LOW (ref 26.0–34.0)
MCHC: 32.1 g/dL (ref 32.0–36.0)
MCV: 64.4 fL — ABNORMAL LOW (ref 80.0–100.0)
Platelets: 247 10*3/uL (ref 150–440)
RBC: 5.28 MIL/uL — ABNORMAL HIGH (ref 3.80–5.20)
RDW: 14.6 % — ABNORMAL HIGH (ref 11.5–14.5)
WBC: 8 10*3/uL (ref 3.6–11.0)

## 2015-09-28 LAB — CHLAMYDIA/NGC RT PCR (ARMC ONLY)
Chlamydia Tr: NOT DETECTED
N gonorrhoeae: NOT DETECTED

## 2015-09-28 LAB — RAPID HIV SCREEN (HIV 1/2 AB+AG)
HIV 1/2 Antibodies: NONREACTIVE
HIV-1 P24 Antigen - HIV24: NONREACTIVE

## 2015-09-28 MED ORDER — LIDOCAINE HCL (PF) 1 % IJ SOLN
30.0000 mL | INTRAMUSCULAR | Status: DC | PRN
Start: 2015-09-28 — End: 2015-09-29

## 2015-09-28 MED ORDER — LACTATED RINGERS IV SOLN: 500.0000 mL | INTRAVENOUS | Status: DC | PRN

## 2015-09-28 MED ORDER — PROMETHAZINE HCL 25 MG/ML IJ SOLN
25.0000 mg | Freq: Four times a day (QID) | INTRAMUSCULAR | Status: DC | PRN
  Administered 2015-09-28: 25 mg via INTRAMUSCULAR

## 2015-09-28 MED ORDER — BETAMETHASONE SOD PHOS & ACET 6 (3-3) MG/ML IJ SUSP
12.0000 mg | Freq: Once | INTRAMUSCULAR | Status: AC
  Administered 2015-09-28: 12 mg via INTRAMUSCULAR
  Filled 2015-09-28: qty 1

## 2015-09-28 MED ORDER — ONDANSETRON HCL 4 MG/2ML IJ SOLN: 4.0000 mg | Freq: Four times a day (QID) | INTRAMUSCULAR | Status: DC | PRN

## 2015-09-28 MED ORDER — FENTANYL CITRATE (PF) 100 MCG/2ML IJ SOLN
100.0000 ug | INTRAMUSCULAR | Status: DC | PRN
  Administered 2015-09-28 – 2015-09-29 (×4): 100 ug via INTRAVENOUS
  Filled 2015-09-28 (×4): qty 2

## 2015-09-28 MED ORDER — CITRIC ACID-SODIUM CITRATE 334-500 MG/5ML PO SOLN
30.0000 mL | ORAL | Status: DC | PRN
Start: 2015-09-28 — End: 2015-09-29

## 2015-09-28 MED ORDER — PROMETHAZINE HCL 25 MG/ML IJ SOLN: 25.0000 mg | Freq: Four times a day (QID) | INTRAMUSCULAR | Status: DC | PRN

## 2015-09-28 MED ORDER — SODIUM CHLORIDE 0.9 % IV SOLN
2.0000 g | Freq: Once | INTRAVENOUS | Status: AC
  Administered 2015-09-28: 2 g via INTRAVENOUS

## 2015-09-28 MED ORDER — PROMETHAZINE HCL 25 MG/ML IJ SOLN
INTRAMUSCULAR | Status: AC
  Administered 2015-09-28: 25 mg via INTRAMUSCULAR
  Filled 2015-09-28: qty 1

## 2015-09-28 MED ORDER — TERBUTALINE SULFATE 1 MG/ML IJ SOLN: 0.2500 mg | Freq: Once | INTRAMUSCULAR | Status: DC | PRN

## 2015-09-28 MED ORDER — SODIUM CHLORIDE 0.9 % IV SOLN
1.0000 g | INTRAVENOUS | Status: DC
  Administered 2015-09-28 – 2015-09-29 (×3): 1 g via INTRAVENOUS
  Filled 2015-09-28 (×3): qty 1000

## 2015-09-28 MED ORDER — OXYTOCIN 10 UNIT/ML IJ SOLN
INTRAMUSCULAR | Status: AC
  Filled 2015-09-28: qty 2

## 2015-09-28 MED ORDER — OXYTOCIN BOLUS FROM INFUSION: 500.0000 mL | INTRAVENOUS | Status: DC

## 2015-09-28 MED ORDER — MISOPROSTOL 200 MCG PO TABS
ORAL_TABLET | ORAL | Status: AC
  Filled 2015-09-28: qty 4

## 2015-09-28 MED ORDER — LIDOCAINE HCL (PF) 1 % IJ SOLN
INTRAMUSCULAR | Status: AC
  Filled 2015-09-28: qty 30

## 2015-09-28 MED ORDER — OXYTOCIN 40 UNITS IN LACTATED RINGERS INFUSION - SIMPLE MED
INTRAVENOUS | Status: AC
  Administered 2015-09-28: 2 m[IU]/min via INTRAVENOUS
  Filled 2015-09-28: qty 1000

## 2015-09-28 MED ORDER — ACETAMINOPHEN 325 MG PO TABS: 650.0000 mg | ORAL_TABLET | ORAL | Status: DC | PRN

## 2015-09-28 MED ORDER — OXYTOCIN 40 UNITS IN LACTATED RINGERS INFUSION - SIMPLE MED
62.5000 mL/h | INTRAVENOUS | Status: DC
  Filled 2015-09-28: qty 1000

## 2015-09-28 MED ORDER — AMMONIA AROMATIC IN INHA
RESPIRATORY_TRACT | Status: AC
  Filled 2015-09-28: qty 10

## 2015-09-28 MED ORDER — LACTATED RINGERS IV SOLN: INTRAVENOUS | Status: DC

## 2015-09-28 MED ORDER — SODIUM CHLORIDE 0.9 % IV SOLN
INTRAVENOUS | Status: AC
  Administered 2015-09-28: 2 g via INTRAVENOUS
  Filled 2015-09-28: qty 2000

## 2015-09-28 MED ORDER — OXYTOCIN 40 UNITS IN LACTATED RINGERS INFUSION - SIMPLE MED
1.0000 m[IU]/min | INTRAVENOUS | Status: DC
  Administered 2015-09-28: 2 m[IU]/min via INTRAVENOUS

## 2015-09-28 NOTE — H&P (Addendum)
Obstetric History and Physical  Zoe Ramos is a 39 y.o. [phone] with IUP at [redacted]w[redacted]d presenting for rupture of membranes. Patient states she has been having  irregular, every 5-10 minutes contractions, none vaginal bleeding, ruptured, clear fluid membranes, with active fetal movement.    Prenatal Course Source of Care: [facility]  with onset of care at 13 weeks Pregnancy complications or risks: Patient Active Problem List   Diagnosis Date Noted  . Preterm premature rupture of membranes (PPROM) with onset of labor within 24 hours of rupture in third trimester, antepartum 09/28/2015  . Pregnancy 09/27/2015  . Depressed bipolar affective disorder ([place]) 08/09/2015  . Schizo-affective schizophrenia, in remission ([place]) 08/09/2015  . Labor and delivery, indication for care 07/18/2015  . Pelvic pressure in female 07/06/2015  . AMA (advanced maternal age) multigravida 35+ 05/17/2015  . H/O preterm delivery, currently pregnant 05/17/2015   She plans to breastfeed She desires bilateral tubal ligation for postpartum contraception.   Prenatal labs and studies: ABO, Rh: --/--/B POS (11/25 1744) Antibody: NEG (11/25 1743) Rubella: Immune (05/03 0000) RPR: Nonreactive (05/03 0000)  HBsAg: Negative (05/03 0000)  HIV: Non-reactive (05/03 0000)  GBS:  1 hr Glucola  normal Genetic screening normal Anatomy US normal   Past Medical History  Diagnosis Date  . Anemia   . Mental disorder     bipolar  . Nausea and vomiting during pregnancy   . GERD (gastroesophageal reflux disease)   . History of hiatal hernia   . History of trichomonal vaginitis     07/2015    Past Surgical History  Procedure Laterality Date  . Hernia repair    . Gallbladder surgery    . Uterine ablasion      OB History  Gravida Para Term Preterm AB SAB TAB Ectopic Multiple Living  7 6 0 1 0 0 0 0 1 7     # Outcome Date GA Lbr Len/2nd Weight Sex Delivery Anes PTL Lv  7 Current           6 Para           5  Para           4 Para           3 Para           2 Para           1A Preterm           1B Preterm         Y      Social History   Social History  . Marital Status: Single    Spouse Name: N/A  . Number of Children: N/A  . Years of Education: N/A   Social History Main Topics  . Smoking status: Current Every Day Smoker -- 1.00 packs/day for 20 years    Types: Cigarettes  . Smokeless tobacco: Never Used  . Alcohol Use: No  . Drug Use: No  . Sexual Activity: Yes    Birth Control/ Protection: None   Other Topics Concern  . None   Social History Narrative    Family History  Problem Relation Age of Onset  . Cancer Mother     ovarian, breast  . Diabetes Father   . Diabetes Sister     Prescriptions prior to admission  Medication Sig Dispense Refill Last Dose  . Butalbital-Acetaminophen 50-300 MG TABS Take 1 tablet by mouth 2 (two) times daily. 60 tablet 0 09/27/2015 at  Unknown time  . Prenatal MV-Min-Fe Fum-FA-DHA (PRENATAL 1 PO) Take by mouth 1 day or 1 dose.   09/27/2015 at Unknown time  . promethazine (PHENERGAN) 25 MG tablet Take 1 tablet (25 mg total) by mouth every 6 (six) hours as needed for nausea or vomiting. 30 tablet 2 09/27/2015 at Unknown time  . sertraline (ZOLOFT) 50 MG tablet Take 1 tablet (50 mg total) by mouth daily. 30 tablet 4 09/27/2015 at Unknown time    Allergies  Allergen Reactions  . Morphine And Related Shortness Of Breath  . Orange Fruit [Citrus] Hives    Review of Systems: Negative except for what is mentioned in HPI.  Physical Exam: Ht 5\' 7"  (1.702 m)  Wt 213 lb (96.616 kg)  BMI 33.35 kg/m2  LMP 01/16/2015 (Exact Date) CONSTITUTIONAL: Well-developed, well-nourished female in no acute distress.  HENT:  Normocephalic, atraumatic, External right and left ear normal. Oropharynx is clear and moist EYES: Conjunctivae and EOM are normal. Pupils are equal, round, and reactive to light. No scleral icterus.  NECK: Normal range of motion,  supple, no masses SKIN: Skin is warm and dry. No rash noted. Not diaphoretic. No erythema. No pallor. [place]: Alert and oriented to person, place, and time. Normal reflexes, muscle tone coordination. No cranial nerve deficit noted. PSYCHIATRIC: Normal mood and affect. Normal behavior. Normal judgment and thought content. CARDIOVASCULAR: Normal heart rate noted, regular rhythm RESPIRATORY: Effort and breath sounds normal, no problems with respiration noted ABDOMEN: Soft, nontender, nondistended, gravid. MUSCULOSKELETAL: Normal range of motion. No edema and no tenderness. 2+ distal pulses.  Cervical Exam: Dilatation 5cm   Effacement 70%   Station -2   Presentation: cephalic FHT:  Baseline rate 140 bpm   Variability minimal to moderate  Accelerations present   Decelerations variable Contractions: Every 3-4 mins   Pertinent Labs/Studies:   Results for orders placed or performed during the hospital encounter of 09/28/15 (from the past 24 hour(s))  CBC     Status: Abnormal   Collection Time: 09/28/15  5:43 PM  Result Value Ref Range   WBC 8.0 3.6 - 11.0 K/uL   RBC 5.28 (H) 3.80 - 5.20 MIL/uL   Hemoglobin 10.9 (L) 12.0 - 16.0 g/dL   HCT 34.0 (L) 35.0 - 47.0 %   MCV 64.4 (L) 80.0 - 100.0 fL   MCH 20.7 (L) 26.0 - 34.0 pg   MCHC 32.1 32.0 - 36.0 g/dL   RDW 14.6 (H) 11.5 - 14.5 %   Platelets 247 150 - 440 K/uL  Type and screen [place]     Status: None   Collection Time: 09/28/15  5:43 PM  Result Value Ref Range   ABO/RH(D) B POS    Antibody Screen NEG    Sample Expiration 10/01/2015   Rapid HIV screen (HIV 1/2 Ab+Ag) ([facility] Only)     Status: None   Collection Time: 09/28/15  5:43 PM  Result Value Ref Range   HIV-1 P24 Antigen - HIV24 NON REACTIVE NON REACTIVE   HIV 1/2 Antibodies NON REACTIVE NON REACTIVE   Interpretation (HIV Ag Ab)      A non reactive test result means that HIV 1 or HIV 2 antibodies and HIV 1 p24 antigen were not detected in the specimen.   ABO/Rh     Status: None   Collection Time: 09/28/15  5:44 PM  Result Value Ref Range   ABO/RH(D) B POS     Assessment : Zoe Ramos is a 39 y.o.  [id number] at [redacted]w[redacted]d being admitted for PPROM and preterm labor.  Plan: Labor: Active management.  Augmentation as needed, per protocol FWB: Reassuring fetal heart tracing overall.  GBS unknown.  Will treat with Ampicillin.  GBS swab obtained. Also, will adminster dose of antenatal steroids.  Delivery plan: Hopeful for vaginal delivery   Rubie Maid, MD [facility]

## 2015-09-29 ENCOUNTER — Inpatient Hospital Stay: Payer: Medicaid Other | Admitting: Anesthesiology

## 2015-09-29 ENCOUNTER — Encounter
Admission: EM | Disposition: A | Discharge status: Home / Self Care | Payer: Self-pay | Source: Home / Self Care | Attending: Obstetrics and Gynecology

## 2015-09-29 ENCOUNTER — Encounter: Payer: Self-pay | Admitting: Certified Nurse Midwife

## 2015-09-29 HISTORY — PX: TUBAL LIGATION: SHX77

## 2015-09-29 LAB — CULTURE, BETA STREP (GROUP B ONLY): Strep Gp B Culture: POSITIVE — AB

## 2015-09-29 SURGERY — LIGATION, FALLOPIAN TUBE, POSTPARTUM
Anesthesia: General | Laterality: Bilateral

## 2015-09-29 MED ORDER — OXYCODONE-ACETAMINOPHEN 5-325 MG PO TABS
1.0000 | ORAL_TABLET | ORAL | Status: DC | PRN
  Administered 2015-09-29 – 2015-09-30 (×5): 1 via ORAL
  Filled 2015-09-29 (×6): qty 1

## 2015-09-29 MED ORDER — FENTANYL CITRATE (PF) 100 MCG/2ML IJ SOLN
25.0000 ug | INTRAMUSCULAR | Status: DC | PRN
  Administered 2015-09-29 (×4): 25 ug via INTRAVENOUS

## 2015-09-29 MED ORDER — SUCCINYLCHOLINE CHLORIDE 20 MG/ML IJ SOLN
INTRAMUSCULAR | Status: DC | PRN
  Administered 2015-09-29: 100 mg via INTRAVENOUS

## 2015-09-29 MED ORDER — BENZOCAINE-MENTHOL 20-0.5 % EX AERO: 1.0000 "application " | INHALATION_SPRAY | CUTANEOUS | Status: DC | PRN

## 2015-09-29 MED ORDER — LACTATED RINGERS IV SOLN
INTRAVENOUS | Status: DC | PRN
  Administered 2015-09-29: 10:00:00 via INTRAVENOUS

## 2015-09-29 MED ORDER — MIDAZOLAM HCL 2 MG/2ML IJ SOLN
INTRAMUSCULAR | Status: DC | PRN
  Administered 2015-09-29: 2 mg via INTRAVENOUS

## 2015-09-29 MED ORDER — BUPIVACAINE HCL 0.5 % IJ SOLN
INTRAMUSCULAR | Status: DC | PRN
  Administered 2015-09-29: 10 mL

## 2015-09-29 MED ORDER — SIMETHICONE 80 MG PO CHEW: 80.0000 mg | CHEWABLE_TABLET | ORAL | Status: DC | PRN

## 2015-09-29 MED ORDER — IBUPROFEN 800 MG PO TABS
800.0000 mg | ORAL_TABLET | Freq: Four times a day (QID) | ORAL | Status: DC
  Administered 2015-09-29 – 2015-10-01 (×7): 800 mg via ORAL
  Filled 2015-09-29 (×7): qty 1

## 2015-09-29 MED ORDER — ROCURONIUM BROMIDE 100 MG/10ML IV SOLN
INTRAVENOUS | Status: DC | PRN
  Administered 2015-09-29: 20 mg via INTRAVENOUS

## 2015-09-29 MED ORDER — DOCUSATE SODIUM 100 MG PO CAPS
100.0000 mg | ORAL_CAPSULE | Freq: Two times a day (BID) | ORAL | Status: DC
  Administered 2015-09-30 – 2015-10-01 (×4): 100 mg via ORAL
  Filled 2015-09-29 (×3): qty 1

## 2015-09-29 MED ORDER — ONDANSETRON HCL 4 MG/2ML IJ SOLN: 4.0000 mg | Freq: Once | INTRAMUSCULAR | Status: DC | PRN

## 2015-09-29 MED ORDER — ONDANSETRON HCL 4 MG/2ML IJ SOLN: 4.0000 mg | INTRAMUSCULAR | Status: DC | PRN

## 2015-09-29 MED ORDER — SERTRALINE HCL 25 MG PO TABS
50.0000 mg | ORAL_TABLET | Freq: Every day | ORAL | Status: DC
  Administered 2015-09-29 – 2015-10-01 (×3): 50 mg via ORAL
  Filled 2015-09-29 (×2): qty 2
  Filled 2015-09-29: qty 1

## 2015-09-29 MED ORDER — ONDANSETRON HCL 4 MG/2ML IJ SOLN
INTRAMUSCULAR | Status: DC | PRN
  Administered 2015-09-29: 4 mg via INTRAVENOUS

## 2015-09-29 MED ORDER — ONDANSETRON HCL 4 MG PO TABS: 4.0000 mg | ORAL_TABLET | ORAL | Status: DC | PRN

## 2015-09-29 MED ORDER — DIPHENHYDRAMINE HCL 25 MG PO CAPS: 25.0000 mg | ORAL_CAPSULE | Freq: Four times a day (QID) | ORAL | Status: DC | PRN

## 2015-09-29 MED ORDER — ACETAMINOPHEN 325 MG PO TABS: 650.0000 mg | ORAL_TABLET | ORAL | Status: DC | PRN

## 2015-09-29 MED ORDER — PRENATAL MULTIVITAMIN CH
1.0000 | ORAL_TABLET | Freq: Every day | ORAL | Status: DC
  Administered 2015-09-29 – 2015-10-01 (×3): 1 via ORAL
  Filled 2015-09-29 (×2): qty 1

## 2015-09-29 MED ORDER — LIDOCAINE HCL (CARDIAC) 20 MG/ML IV SOLN
INTRAVENOUS | Status: DC | PRN
  Administered 2015-09-29: 50 mg via INTRAVENOUS

## 2015-09-29 MED ORDER — BUPIVACAINE HCL (PF) 0.5 % IJ SOLN
INTRAMUSCULAR | Status: AC
  Filled 2015-09-29: qty 30

## 2015-09-29 MED ORDER — IBUPROFEN 600 MG PO TABS: 600.0000 mg | ORAL_TABLET | Freq: Four times a day (QID) | ORAL | Status: DC

## 2015-09-29 MED ORDER — FENTANYL CITRATE (PF) 100 MCG/2ML IJ SOLN
INTRAMUSCULAR | Status: DC | PRN
  Administered 2015-09-29: 50 ug via INTRAVENOUS
  Administered 2015-09-29: 100 ug via INTRAVENOUS
  Administered 2015-09-29: 50 ug via INTRAVENOUS

## 2015-09-29 MED ORDER — OXYCODONE-ACETAMINOPHEN 5-325 MG PO TABS
2.0000 | ORAL_TABLET | ORAL | Status: DC | PRN
  Administered 2015-09-30 – 2015-10-01 (×2): 2 via ORAL
  Filled 2015-09-29 (×2): qty 2

## 2015-09-29 MED ORDER — LANOLIN HYDROUS EX OINT: TOPICAL_OINTMENT | CUTANEOUS | Status: DC | PRN

## 2015-09-29 MED ORDER — GLYCOPYRROLATE 0.2 MG/ML IJ SOLN
INTRAMUSCULAR | Status: DC | PRN
  Administered 2015-09-29: 0.2 mg via INTRAVENOUS

## 2015-09-29 MED ORDER — DEXAMETHASONE SODIUM PHOSPHATE 4 MG/ML IJ SOLN
INTRAMUSCULAR | Status: DC | PRN
  Administered 2015-09-29: 4 mg via INTRAVENOUS

## 2015-09-29 SURGICAL SUPPLY — 28 items
BLADE SURG SZ11 CARB STEEL (BLADE) ×2 IMPLANT
CHLORAPREP W/TINT 26ML (MISCELLANEOUS) ×2 IMPLANT
DRAPE LAPAROTOMY 100X77 ABD (DRAPES) ×2 IMPLANT
DRSG TEGADERM 2-3/8X2-3/4 SM (GAUZE/BANDAGES/DRESSINGS) ×2 IMPLANT
GAUZE SPONGE NON-WVN 2X2 STRL (MISCELLANEOUS) ×1 IMPLANT
GLOVE BIO SURGEON STRL SZ 6 (GLOVE) ×4 IMPLANT
GLOVE BIOGEL PI IND STRL 6.5 (GLOVE) ×1 IMPLANT
GLOVE BIOGEL PI INDICATOR 6.5 (GLOVE) ×1
GOWN STRL REUS W/ TWL LRG LVL3 (GOWN DISPOSABLE) ×2 IMPLANT
GOWN STRL REUS W/TWL LRG LVL3 (GOWN DISPOSABLE) ×4
KIT RM TURNOVER CYSTO AR (KITS) ×2 IMPLANT
LABEL OR SOLS (LABEL) ×2 IMPLANT
LIQUID BAND (GAUZE/BANDAGES/DRESSINGS) ×1 IMPLANT
NDL HYPO 25GX1X1/2 BEV (NEEDLE) ×1 IMPLANT
NEEDLE HYPO 25GX1X1/2 BEV (NEEDLE) ×2 IMPLANT
NS IRRIG 500ML POUR BTL (IV SOLUTION) ×2 IMPLANT
PACK BASIN MINOR ARMC (MISCELLANEOUS) ×2 IMPLANT
SPONGE VERSALON 2X2 STRL (MISCELLANEOUS) ×2
SUT MNCRL 4-0 (SUTURE) ×2
SUT MNCRL 4-0 27XMFL (SUTURE) ×1
SUT PDS PLUS 0 (SUTURE) ×2
SUT PDS PLUS AB 0 CT-2 (SUTURE) IMPLANT
SUT PLAIN GUT 0 (SUTURE) ×4 IMPLANT
SUT VIC AB 0 CT1 36 (SUTURE) ×2 IMPLANT
SUT VIC AB 0 SH 27 (SUTURE) ×2 IMPLANT
SUT VICRYL 0 AB UR-6 (SUTURE) ×4 IMPLANT
SUTURE MNCRL 4-0 27XMF (SUTURE) ×1 IMPLANT
SYRINGE 10CC LL (SYRINGE) ×2 IMPLANT

## 2015-09-29 NOTE — Progress Notes (Signed)
Trudee Kuster, CNM notified of 3 min. prolonged decel, down to 90s, position changed, pitocin discontinued, FHR return to baseline. RN will restart Pitocin at 29mu after fetal tracing improves.  CNM in agreement.

## 2015-09-29 NOTE — Progress Notes (Signed)
PRE-OPERATIVE PROGRESS NOTE  Pre-operative Diagnosis: Postpartum Day#0 s/p SVD, preterm delivery, grand multiparity, desires permanent sterilization  Surgeon: Rubie Maid, MD  Assistants: None  Procedure: Postpartum bilateral tubal ligation  Anesthesia: General endotracheal anesthesia  Labs:  ABO, Rh: --/--/B POS (11/25 1744) Antibody: NEG (11/25 1743) Rubella: Immune (05/03 0000) RPR: Nonreactive (05/03 0000)  HBsAg: Negative (05/03 0000)  HIV: Non-reactive (05/03 0000)  GBS: unknown  Lab Results  Component Value Date   WBC 8.0 09/28/2015   HGB 10.9* 09/28/2015   HCT 34.0* 09/28/2015   MCV 64.4* 09/28/2015   PLT 247 09/28/2015     Consent: Patient desires permanent sterilization.  Other reversible forms of contraception were discussed with patient; she declines all other modalities. Risks of procedure discussed with patient including but not limited to: risk of regret, permanence of method, bleeding, infection, injury to surrounding organs and need for additional procedures.  Failure risk of 1-2 % with increased risk of ectopic gestation if pregnancy occurs was also discussed with patient.  Patient verbalized understanding of these risks and wants to proceed with sterilization.  Written informed consent obtained.  To OR when ready.   Rubie Maid, MD [facility]

## 2015-09-29 NOTE — Anesthesia Procedure Notes (Signed)
Procedure Name: Intubation Date/Time: 09/29/2015 9:58 AM Performed by: Eliberto Ivory Pre-anesthesia Checklist: Patient identified, Patient being monitored, Timeout performed, Emergency Drugs available and Suction available Patient Re-evaluated:Patient Re-evaluated prior to inductionOxygen Delivery Method: Circle system utilized Preoxygenation: Pre-oxygenation with 100% oxygen Intubation Type: IV induction Ventilation: Mask ventilation without difficulty Laryngoscope Size: Mac and 3 Grade View: Grade I Tube type: Oral Tube size: 7.0 mm Number of attempts: 1 Airway Equipment and Method: Stylet Placement Confirmation: ETT inserted through vocal cords under direct vision,  positive ETCO2 and breath sounds checked- equal and bilateral Secured at: 21 cm Tube secured with: Tape Dental Injury: Teeth and Oropharynx as per pre-operative assessment

## 2015-09-29 NOTE — Anesthesia Preprocedure Evaluation (Signed)
Anesthesia Evaluation  Patient identified by MRN, date of birth, ID band Patient awake    Reviewed: Allergy & Precautions, NPO status , Patient's Chart, lab work & pertinent test results, reviewed documented beta blocker date and time   Airway Mallampati: II  TM Distance: >3 FB     Dental  (+) Chipped   Pulmonary Current Smoker,           Cardiovascular      Neuro/Psych PSYCHIATRIC DISORDERS Depression Bipolar Disorder Schizophrenia    GI/Hepatic hiatal hernia, GERD  Controlled,  Endo/Other    Renal/GU      Musculoskeletal   Abdominal   Peds  Hematology  (+) anemia ,   Anesthesia Other Findings Obesity.  Reproductive/Obstetrics                             Anesthesia Physical Anesthesia Plan  ASA: II  Anesthesia Plan: General   Post-op Pain Management:    Induction: Intravenous and Rapid sequence  Airway Management Planned: Oral ETT  Additional Equipment:   Intra-op Plan:   Post-operative Plan:   Informed Consent: I have reviewed the patients History and Physical, chart, labs and discussed the procedure including the risks, benefits and alternatives for the proposed anesthesia with the patient or authorized representative who has indicated his/her understanding and acceptance.     Plan Discussed with: CRNA  Anesthesia Plan Comments:         Anesthesia Quick Evaluation

## 2015-09-29 NOTE — Progress Notes (Signed)
Gauze dressing to umbilicus from post partum bilateral tubal ligation was saturated with bloody/serousanginous drainage---removed old dressing to assess site and it is clean and dry with no obvious drainage.  Replaced dressing with 4x4 gauze and tegaderm.

## 2015-09-29 NOTE — Transfer of Care (Signed)
Immediate Anesthesia Transfer of Care Note  Patient: Zoe Ramos  Procedure(s) Performed: Procedure(s): POST PARTUM TUBAL LIGATION (Bilateral)  Patient Location: PACU  Anesthesia Type:General  Level of Consciousness: awake, alert  and oriented  Airway & Oxygen Therapy: Patient Spontanous Breathing  Post-op Assessment: Report given to RN  Post vital signs: stable  Last Vitals:  Filed Vitals:   09/29/15 0900 09/29/15 1104  BP: 110/64 125/77  Pulse: 65 103  Temp:  36.8 C  Resp:  18    Complications: No apparent anesthesia complications

## 2015-09-29 NOTE — Op Note (Signed)
Procedure(s): POST PARTUM TUBAL LIGATION Procedure Note  Zoe Ramos female 39 y.o. 09/29/2015  Indications: The patient is a 39 y.o. [id number] female, Day #0 s/p SVD (preterm @ 35 weeks), desires permanent sterilization  Pre-operative Diagnosis: [place] multiparity, postpartum state (Day #0), desires permanent sterilization  Post-operative Diagnosis: Same  Surgeon: Rubie Maid, MD  Assistants: None  Anesthesia: General endotracheal anesthesia  ASA Class: II  Procedure Details: The patient was seen in the Holding Room. The risks, benefits, complications, treatment options, and expected outcomes were discussed with the patient.  The patient concurred with the proposed plan, giving informed consent.  The site of surgery properly noted/marked. The patient was taken to the Operating Room, identified as Zoe Ramos and the procedure verified as POST PARTUM TUBAL LIGATION (Bilateral). A Time Out was held and the above information confirmed.   The patient was placed under general anesthesia without difficulty. She was then placed in the dorsal supine position and prepped and draped in sterile fashion.  A small transverse skin incision was made under the umbilical fold. The incision was taken down to the layer of fascia using the scalpel, and fascia was incised, and extended bilaterally using Mayo scissors. The peritoneum was entered in a sharp fashion. Attention was then turned to the patient's uterus, and left fallopian tube was identified and followed out to the fimbriated end.  The Babcock clamp was then used to grasp the tube approximately 4 cm from the cornual region.  A 3 cm segment of tube was then ligated with a free tie of 0-Chromic using the [place] method and excised.  The right fallopian tube was then ligated in a similar fashion and excised. The tubal lumens were cauterized bilaterally.  Good hemostasis was noted with bilateral fallopian tubes. The instruments were then removed  from the patient's abdomen and the fascial incision was repaired with 0 PDS, and the subcutaneous fat tissue layer was repaired with 3-0 Vicryl in a running fashion.  The skin was closed with a 4-0 Vicryl subcuticular stitch. The patient tolerated the procedure well.  Instrument, sponge, and needle counts were correct times two.  The patient was then taken to the recovery room awake and in stable condition.  Findings: Normal uterus, tubes, and ovaries.  Uterus 1 cm above umbilicus.  Dense scarring in infraumbilical region with suture material present (from prior hernia repair)  Estimated Blood Loss:  minimal      Drains: None.  Patient voided prior to procedure.         Total IV Fluids:  350 ml of Lactated Ringers  Specimens: Segments of left and right fallopian tube, sent to pathology         Implants: None         Complications:  None; patient tolerated the procedure well.         Disposition: PACU - hemodynamically stable.         Condition: stable   Rubie Maid, MD [facility]

## 2015-09-30 LAB — CBC
HCT: 31.3 % — ABNORMAL LOW (ref 35.0–47.0)
Hemoglobin: 9.8 g/dL — ABNORMAL LOW (ref 12.0–16.0)
MCH: 20 pg — ABNORMAL LOW (ref 26.0–34.0)
MCHC: 31.2 g/dL — ABNORMAL LOW (ref 32.0–36.0)
MCV: 64.2 fL — ABNORMAL LOW (ref 80.0–100.0)
Platelets: 254 10*3/uL (ref 150–440)
RBC: 4.87 MIL/uL (ref 3.80–5.20)
RDW: 14.8 % — ABNORMAL HIGH (ref 11.5–14.5)
WBC: 13.7 10*3/uL — ABNORMAL HIGH (ref 3.6–11.0)

## 2015-09-30 LAB — RPR: RPR Ser Ql: NONREACTIVE

## 2015-09-30 NOTE — Anesthesia Postprocedure Evaluation (Signed)
Anesthesia Post Note  Patient: Zoe Ramos  Procedure(s) Performed: Procedure(s) (LRB): POST PARTUM TUBAL LIGATION (Bilateral)  Patient location during evaluation: PACU Anesthesia Type: General Level of consciousness: awake and alert Pain management: pain level controlled Vital Signs Assessment: post-procedure vital signs reviewed and stable Respiratory status: spontaneous breathing, nonlabored ventilation, respiratory function stable and patient connected to nasal cannula oxygen Cardiovascular status: blood pressure returned to baseline and stable Postop Assessment: No signs of nausea or vomiting Anesthetic complications: no    Last Vitals:  Filed Vitals:   09/30/15 0725 09/30/15 1232  BP: 110/65   Pulse: 70   Temp: 36.8 C 36.9 C  Resp: 18     Last Pain:  Filed Vitals:   09/30/15 1232  PainSc: 0-No pain                 Grason Brailsford S

## 2015-09-30 NOTE — Discharge Instructions (Signed)
Call your doctor for increased pain or vaginal bleeding, temperature above 100.4, depression, or concerns.  No strenuous activity or heavy lifting for 6 weeks.  No intercourse, tampons, douching, or enemas for 6 weeks.  No tub baths-showers only.  No driving for 2 weeks or while taking pain medications.  Continue prenatal vitamin and iron.   °

## 2015-09-30 NOTE — Progress Notes (Signed)
Post Partum Day 1 Subjective: up ad lib, voiding, + flatus and some lower pelvic pain  Objective: Blood pressure 110/65, pulse 70, temperature 98.3 F (36.8 C), temperature source Oral, resp. rate 18, height 5\' 7"  (1.702 m), weight 96.616 kg (213 lb), last menstrual period 01/16/2015, SpO2 99 %, unknown if currently breastfeeding.  Physical Exam:  General: alert, cooperative and appears stated age Lochia: appropriate Uterine Fundus: firm Incision: slight clear drainage present DVT Evaluation: No evidence of DVT seen on physical exam. Negative Homan's sign.   Recent Labs  09/28/15 1743 09/30/15 0623  HGB 10.9* 9.8*  HCT 34.0* 31.3*    Assessment/Plan: Plan for discharge tomorrow Infant feeding only Breast;    LOS: 2 days   Lex Linhares N Jachob Mcclean 09/30/2015, 9:01 AM

## 2015-10-01 ENCOUNTER — Encounter: Payer: Self-pay | Admitting: Obstetrics and Gynecology

## 2015-10-01 MED ORDER — SERTRALINE HCL 50 MG PO TABS: 50.0000 mg | ORAL_TABLET | Freq: Every day | ORAL | Status: DC

## 2015-10-01 MED ORDER — HALOPERIDOL DECANOATE 100 MG/ML IM SOLN
100.0000 mg | Freq: Once | INTRAMUSCULAR | Status: AC
  Administered 2015-10-01: 100 mg via INTRAMUSCULAR
  Filled 2015-10-01: qty 1

## 2015-10-01 MED ORDER — IBUPROFEN 800 MG PO TABS: 800.0000 mg | ORAL_TABLET | Freq: Four times a day (QID) | ORAL | Status: DC

## 2015-10-01 MED ORDER — OXYCODONE-ACETAMINOPHEN 5-325 MG PO TABS
1.0000 | ORAL_TABLET | ORAL | Status: DC | PRN
Start: 2015-10-01 — End: 2019-11-21

## 2015-10-01 NOTE — Discharge Summary (Signed)
Obstetric Discharge Summary Reason for Admission: onset of labor Prenatal Procedures: ultrasound Intrapartum Procedures: spontaneous vaginal delivery Postpartum Procedures: P.P. tubal ligation Complications-Operative and Postpartum: none HEMOGLOBIN  Date Value Ref Range Status  09/30/2015 9.8* 12.0 - 16.0 g/dL Final   HGB  Date Value Ref Range Status  12/06/2014 11.3* 12.0-16.0 g/dL Final   HCT  Date Value Ref Range Status  09/30/2015 31.3* 35.0 - 47.0 % Final  12/06/2014 35.7 35.0-47.0 % Final   HEMATOCRIT  Date Value Ref Range Status  08/09/2015 30.4* 34.0 - 46.6 % Final    Physical Exam:  General: alert, cooperative and appears stated age 39: appropriate Uterine Fundus: firm Incision: healing well, no significant drainage, slight clear drainage present DVT Evaluation: No evidence of DVT seen on physical exam. Negative Homan's sign.  Discharge Diagnoses: Term Pregnancy-delivered and anemic and PPTL  Discharge Information: Date: 10/01/2015 Activity: pelvic rest Diet: routine Medications: PNV, Tylenol #3, Ibuprofen, Iron and Zoloft 50mg , also to get next Haldol injection from PCP Condition: stable Instructions: refer to practice specific booklet Discharge to: home   Newborn Data: Live born female "Princess"  Bottle feeding only Birth Weight: 5 lb 10 oz (2550 g) APGAR: 7, 9  Home with mother.  Zoe Ramos N Zoe Ramos 10/01/2015, 9:16 AM

## 2015-10-02 LAB — SURGICAL PATHOLOGY

## 2015-10-04 ENCOUNTER — Encounter: Payer: Medicaid Other | Admitting: Obstetrics and Gynecology

## 2015-10-10 ENCOUNTER — Encounter: Payer: Medicaid Other | Admitting: Obstetrics and Gynecology

## 2015-10-17 ENCOUNTER — Telehealth: Payer: Self-pay | Admitting: Obstetrics and Gynecology

## 2015-10-17 ENCOUNTER — Other Ambulatory Visit: Payer: Self-pay | Admitting: Obstetrics and Gynecology

## 2015-10-17 NOTE — Telephone Encounter (Signed)
pls advise

## 2015-10-17 NOTE — Telephone Encounter (Signed)
We don't usually prescrib anything stronger than motrin this far out from surgery, please let her know if her pain is worsening i would want to see her to be sure everything is ok.

## 2015-10-17 NOTE — Telephone Encounter (Signed)
Zoe Ramos. MOTRIN IS NOT WORKING.

## 2015-10-18 NOTE — Telephone Encounter (Signed)
Advised pt she states she will will [place] in the am for appt

## 2015-11-16 ENCOUNTER — Ambulatory Visit: Payer: Medicaid Other | Admitting: Obstetrics and Gynecology

## 2015-12-18 ENCOUNTER — Ambulatory Visit (INDEPENDENT_AMBULATORY_CARE_PROVIDER_SITE_OTHER): Payer: Medicaid Other | Admitting: Obstetrics and Gynecology

## 2015-12-18 ENCOUNTER — Encounter: Payer: Self-pay | Admitting: Obstetrics and Gynecology

## 2015-12-18 VITALS — BP 128/78 | HR 98 | Ht 67.0 in | Wt 229.7 lb

## 2015-12-18 DIAGNOSIS — N760 Acute vaginitis: Secondary | ICD-10-CM

## 2015-12-18 DIAGNOSIS — B9689 Other specified bacterial agents as the cause of diseases classified elsewhere: Secondary | ICD-10-CM

## 2015-12-18 DIAGNOSIS — A499 Bacterial infection, unspecified: Secondary | ICD-10-CM

## 2015-12-18 MED ORDER — METRONIDAZOLE 500 MG PO TABS: 500.0000 mg | ORAL_TABLET | Freq: Two times a day (BID) | ORAL | Status: DC

## 2015-12-18 NOTE — Progress Notes (Signed)
Subjective:     Patient ID: Zoe Ramos, female   DOB: 07-07-1976, 40 y.o.   MRN: [id number]  HPI H/o STD in past pregnancy, desires retesting as she had unprotected sex 10 days ago with previous partner, denies any symptoms ecept postcoital spotting.  Review of Systems See above    Objective:   Physical Exam A&O x4  well groomed female Pelvic exam: normal external genitalia, vulva, vagina, cervix, uterus and adnexa, WET MOUNT done - results: KOH done, clue cells, DNA probe for chlamydia and GC obtained.    Assessment:     BV STD screen     Plan:     Flagyl 500mg  bid RX sent in Counseled on BV Will call with STD swab.  Shaneece Stockburger [place], CNM

## 2015-12-18 NOTE — Patient Instructions (Signed)

## 2015-12-21 LAB — NUSWAB VAGINITIS PLUS (VG+)
Atopobium vaginae: HIGH Score — AB
BVAB 2: HIGH Score — AB
Candida albicans, NAA: NEGATIVE
Candida glabrata, NAA: NEGATIVE
Chlamydia trachomatis, NAA: NEGATIVE
Megasphaera 1: HIGH Score — AB
Neisseria gonorrhoeae, NAA: NEGATIVE
Trich vag by NAA: NEGATIVE

## 2017-06-15 ENCOUNTER — Telehealth: Payer: Self-pay | Admitting: Obstetrics and Gynecology

## 2017-06-15 NOTE — Telephone Encounter (Signed)
Patient lvm wanting to receive a call back, I called the patient back and lvm for patient to call the office back at her earliest convenience. Thank you.

## 2018-09-23 ENCOUNTER — Other Ambulatory Visit: Payer: Self-pay | Admitting: Family Medicine

## 2018-09-23 ENCOUNTER — Ambulatory Visit
Admission: RE | Admit: 2018-09-23 | Discharge: 2018-09-23 | Disposition: A | Discharge status: Home / Self Care | Payer: Medicaid Other | Source: Ambulatory Visit | Attending: Family Medicine | Admitting: Family Medicine

## 2018-09-23 DIAGNOSIS — X58XXXA Exposure to other specified factors, initial encounter: Secondary | ICD-10-CM | POA: Insufficient documentation

## 2018-09-23 DIAGNOSIS — S8252XA Displaced fracture of medial malleolus of left tibia, initial encounter for closed fracture: Secondary | ICD-10-CM | POA: Diagnosis not present

## 2018-09-23 DIAGNOSIS — R52 Pain, unspecified: Secondary | ICD-10-CM

## 2018-09-23 DIAGNOSIS — M25572 Pain in left ankle and joints of left foot: Secondary | ICD-10-CM | POA: Diagnosis not present

## 2018-09-23 DIAGNOSIS — M79672 Pain in left foot: Secondary | ICD-10-CM | POA: Diagnosis not present

## 2018-10-07 DIAGNOSIS — S8253XA Displaced fracture of medial malleolus of unspecified tibia, initial encounter for closed fracture: Secondary | ICD-10-CM | POA: Insufficient documentation

## 2019-10-20 ENCOUNTER — Other Ambulatory Visit: Payer: Self-pay | Admitting: Family Medicine

## 2019-10-20 ENCOUNTER — Telehealth: Payer: Self-pay | Admitting: *Deleted

## 2019-10-20 DIAGNOSIS — R748 Abnormal levels of other serum enzymes: Secondary | ICD-10-CM

## 2019-10-31 ENCOUNTER — Ambulatory Visit
Admission: RE | Admit: 2019-10-31 | Discharge: 2019-10-31 | Disposition: A | Discharge status: Home / Self Care | Payer: Medicaid Other | Source: Ambulatory Visit | Attending: Family Medicine | Admitting: Family Medicine

## 2019-10-31 ENCOUNTER — Other Ambulatory Visit: Payer: Self-pay

## 2019-10-31 ENCOUNTER — Other Ambulatory Visit: Payer: Self-pay | Admitting: Family Medicine

## 2019-10-31 DIAGNOSIS — R748 Abnormal levels of other serum enzymes: Secondary | ICD-10-CM | POA: Insufficient documentation

## 2019-10-31 DIAGNOSIS — M25562 Pain in left knee: Secondary | ICD-10-CM

## 2019-10-31 DIAGNOSIS — M25561 Pain in right knee: Secondary | ICD-10-CM | POA: Diagnosis present

## 2019-11-10 ENCOUNTER — Other Ambulatory Visit: Payer: Self-pay | Admitting: Family Medicine

## 2019-11-10 DIAGNOSIS — N289 Disorder of kidney and ureter, unspecified: Secondary | ICD-10-CM

## 2019-11-14 ENCOUNTER — Other Ambulatory Visit: Payer: Self-pay

## 2019-11-14 ENCOUNTER — Ambulatory Visit
Admission: RE | Admit: 2019-11-14 | Discharge: 2019-11-14 | Disposition: A | Discharge status: Home / Self Care | Payer: Medicaid Other | Source: Ambulatory Visit | Attending: Family Medicine | Admitting: Family Medicine

## 2019-11-14 DIAGNOSIS — N289 Disorder of kidney and ureter, unspecified: Secondary | ICD-10-CM

## 2019-11-14 MED ORDER — GADOBUTROL 1 MMOL/ML IV SOLN
10.0000 mL | Freq: Once | INTRAVENOUS | Status: AC | PRN
  Administered 2019-11-14: 10 mL via INTRAVENOUS

## 2019-11-21 ENCOUNTER — Telehealth: Payer: Self-pay

## 2019-11-21 ENCOUNTER — Other Ambulatory Visit: Payer: Self-pay | Admitting: *Deleted

## 2019-11-21 ENCOUNTER — Encounter: Payer: Self-pay | Admitting: Oncology

## 2019-11-21 ENCOUNTER — Inpatient Hospital Stay: Payer: Medicaid Other | Attending: Oncology | Admitting: Oncology

## 2019-11-21 ENCOUNTER — Inpatient Hospital Stay: Payer: Medicaid Other

## 2019-11-21 ENCOUNTER — Other Ambulatory Visit: Payer: Self-pay

## 2019-11-21 VITALS — BP 123/85 | HR 86 | Temp 98.2°F | Resp 16 | Ht 68.7 in | Wt 262.5 lb

## 2019-11-21 DIAGNOSIS — R5383 Other fatigue: Secondary | ICD-10-CM | POA: Insufficient documentation

## 2019-11-21 DIAGNOSIS — Z79899 Other long term (current) drug therapy: Secondary | ICD-10-CM | POA: Insufficient documentation

## 2019-11-21 DIAGNOSIS — Z8042 Family history of malignant neoplasm of prostate: Secondary | ICD-10-CM | POA: Insufficient documentation

## 2019-11-21 DIAGNOSIS — K219 Gastro-esophageal reflux disease without esophagitis: Secondary | ICD-10-CM | POA: Insufficient documentation

## 2019-11-21 DIAGNOSIS — F319 Bipolar disorder, unspecified: Secondary | ICD-10-CM | POA: Insufficient documentation

## 2019-11-21 DIAGNOSIS — D509 Iron deficiency anemia, unspecified: Secondary | ICD-10-CM

## 2019-11-21 DIAGNOSIS — Z800 Family history of malignant neoplasm of digestive organs: Secondary | ICD-10-CM | POA: Insufficient documentation

## 2019-11-21 DIAGNOSIS — Z791 Long term (current) use of non-steroidal anti-inflammatories (NSAID): Secondary | ICD-10-CM | POA: Diagnosis not present

## 2019-11-21 DIAGNOSIS — F17210 Nicotine dependence, cigarettes, uncomplicated: Secondary | ICD-10-CM | POA: Insufficient documentation

## 2019-11-21 DIAGNOSIS — Z833 Family history of diabetes mellitus: Secondary | ICD-10-CM | POA: Diagnosis not present

## 2019-11-21 DIAGNOSIS — R109 Unspecified abdominal pain: Secondary | ICD-10-CM | POA: Diagnosis not present

## 2019-11-21 DIAGNOSIS — Z803 Family history of malignant neoplasm of breast: Secondary | ICD-10-CM | POA: Diagnosis not present

## 2019-11-21 DIAGNOSIS — N2889 Other specified disorders of kidney and ureter: Secondary | ICD-10-CM

## 2019-11-21 DIAGNOSIS — R202 Paresthesia of skin: Secondary | ICD-10-CM | POA: Diagnosis not present

## 2019-11-21 DIAGNOSIS — R200 Anesthesia of skin: Secondary | ICD-10-CM | POA: Diagnosis not present

## 2019-11-21 DIAGNOSIS — Z8041 Family history of malignant neoplasm of ovary: Secondary | ICD-10-CM | POA: Diagnosis not present

## 2019-11-21 DIAGNOSIS — G8929 Other chronic pain: Secondary | ICD-10-CM | POA: Diagnosis not present

## 2019-11-21 LAB — COMPREHENSIVE METABOLIC PANEL
ALT: 23 U/L (ref 0–44)
AST: 25 U/L (ref 15–41)
Albumin: 3.7 g/dL (ref 3.5–5.0)
Alkaline Phosphatase: 158 U/L — ABNORMAL HIGH (ref 38–126)
Anion gap: 6 (ref 5–15)
BUN: 14 mg/dL (ref 6–20)
CO2: 26 mmol/L (ref 22–32)
Calcium: 8.8 mg/dL — ABNORMAL LOW (ref 8.9–10.3)
Chloride: 106 mmol/L (ref 98–111)
Creatinine, Ser: 0.69 mg/dL (ref 0.44–1.00)
GFR calc Af Amer: >60 mL/min (ref >60)
GFR calc non Af Amer: >60 mL/min (ref >60)
Glucose, Bld: 99 mg/dL (ref 70–99)
Potassium: 4.2 mmol/L (ref 3.5–5.1)
Sodium: 138 mmol/L (ref 135–145)
Total Bilirubin: 0.4 mg/dL (ref 0.3–1.2)
Total Protein: 7.8 g/dL (ref 6.5–8.1)

## 2019-11-21 LAB — CBC WITH DIFFERENTIAL/PLATELET
Abs Immature Granulocytes: 0.02 10*3/uL (ref 0.00–0.07)
Basophils Absolute: 0 10*3/uL (ref 0.0–0.1)
Basophils Relative: 1 %
Eosinophils Absolute: 0.1 10*3/uL (ref 0.0–0.5)
Eosinophils Relative: 2 %
HCT: 37.9 % (ref 36.0–46.0)
Hemoglobin: 11 g/dL — ABNORMAL LOW (ref 12.0–15.0)
Immature Granulocytes: 0 %
Lymphocytes Relative: 34 %
Lymphs Abs: 2.2 10*3/uL (ref 0.7–4.0)
MCH: 19.1 pg — ABNORMAL LOW (ref 26.0–34.0)
MCHC: 29 g/dL — ABNORMAL LOW (ref 30.0–36.0)
MCV: 65.7 fL — ABNORMAL LOW (ref 80.0–100.0)
Monocytes Absolute: 0.4 10*3/uL (ref 0.1–1.0)
Monocytes Relative: 6 %
Neutro Abs: 3.7 10*3/uL (ref 1.7–7.7)
Neutrophils Relative %: 57 %
Platelets: 379 10*3/uL (ref 150–400)
RBC: 5.77 MIL/uL — ABNORMAL HIGH (ref 3.87–5.11)
RDW: 17.3 % — ABNORMAL HIGH (ref 11.5–15.5)
WBC: 6.5 10*3/uL (ref 4.0–10.5)
nRBC: 0 % (ref 0.0–0.2)

## 2019-11-21 LAB — TECHNOLOGIST SMEAR REVIEW: Tech Review: ADEQUATE

## 2019-11-21 LAB — IRON AND TIBC
Iron: 45 ug/dL (ref 28–170)
Saturation Ratios: 11 % (ref 10.4–31.8)
TIBC: 414 ug/dL (ref 250–450)
UIBC: 369 ug/dL

## 2019-11-21 LAB — FERRITIN: Ferritin: 9 ng/mL — ABNORMAL LOW (ref 11–307)

## 2019-11-21 LAB — LACTATE DEHYDROGENASE: LDH: 131 U/L (ref 98–192)

## 2019-11-21 MED ORDER — DOCUSATE SODIUM 100 MG PO CAPS: 100.0000 mg | ORAL_CAPSULE | Freq: Every day | ORAL | 2 refills | Status: DC | PRN

## 2019-11-21 MED ORDER — FERROUS SULFATE 325 (65 FE) MG PO TBEC: 325.0000 mg | DELAYED_RELEASE_TABLET | Freq: Two times a day (BID) | ORAL | 2 refills | Status: DC

## 2019-11-21 NOTE — Progress Notes (Signed)
Patient here for evaluation of abnormal MRI.  She has chronic bilateral leg pain and abdomen pain that is 9/10 today.

## 2019-11-21 NOTE — Research (Addendum)
I met with patient and her daughter today after Dr. Tasia Catchings introduced research study "Blood Sample Collection to Evaluate Biomarkers in Subjects with Untreated Solid Tumors" sponsored by [employer].  I reviewed study with patient and daughter including voluntary participation, purpose of study, and risks and benefits.I also explained that [employer] in seeking participants to give 5 vials of whole blood in order to participate.  [employer] does compensate patients with a gift card. The blood is used by [employer] to further explore biomarkers in newly diagnosed, untreated cancer patients.  Patient nor daughter had any additional questions after study was explained by myself and Dr. Tasia Catchings.  Patient was agreeable to consent and signed consent form that was IRB approved 08/21/2018.  Copy of signed document given to patient.  I escorted patient to lab today as she had lab work ordered by Dr. Tasia Catchings and she was able to also fulfill blood requirement for study.  Blood was drawn via venipuncture.  Patient was given gift card. I thanked patient for her participation.   [facility] Research Associate 11/21/2019 1320

## 2019-11-21 NOTE — Telephone Encounter (Signed)
Patient informed her labs drawn today did show iron deficiency.  Dr. Tasia Catchings has sent a rx for ferrous sulfate 325 mg bid with food.  She also recommends to take a stool softener if the iron causes constipation, MD did send rx for Colace as well.

## 2019-11-21 NOTE — Progress Notes (Signed)
Dear Zoe Ramos,  I had the pleasure seeing Zoe Ramos  for evaluation of kidney mass.  Patient will complete staging work-up and if no distant metastasis, she may be candidate for surgical resection. Attached is a copy of today's clinic visit note.    Thank you for this kind referral and the opportunity to involve me in participating in the care of this patient. Please do not hesitate to call me if any questions or concerns.    Earlie Server MD PhD [facility] Oncology [facility] [phone] Phone: [phone]     Hematology/Oncology Consult note [facility] [phone] [phone]   Patient Care Team: Center, [facility] as PCP - General (General Practice)  REFERRING PROVIDER: Donnie Coffin, MD  CHIEF COMPLAINTS/REASON FOR VISIT:  Evaluation of abnormal MRI, kidney mass  HISTORY OF PRESENTING ILLNESS:   Zoe Ramos is a  44 y.o.  female with PMH listed below was seen in consultation at the request of  Aycock, Ngwe A, MD  for evaluation of abnormal MRI, kidney mass Patient was accompanied by her daughter.  10/31/2019 patient had ultrasound abdomen done for evaluation of elevated alkaline phosphatase.  Her alkaline phosphatase was elevated at 158. Abdomen ultrasound shows no focal liver lesion.  Normal parenchymal echogenicity. Incidental finding of left kidney solid lesion lower pole which is worrisome for RCC. 11/14/2019 patient underwent MRI abdomen with and without contrast which showed enhancing mass arising from left mid kidney with features compatible with RCC.  No evidence of metastatic disease within the upper abdomen. Patient is currently everyday smoker, 1 pack a day for the past 20 years.  Patient has history of bipolar disorder on risperidone and Zoloft. She also complains of chronic abdominal pain and lower extremity pain, numbness and tingling in nature.  No  alleviating or exacerbating factors.  8-9 out of 10.  She takes gabapentin 300 mg 3 times daily which has not improved her pain. Appetite is not very good. Weight has been stable. Denies any unintentional weight loss.  Review of Systems  Constitutional: Positive for appetite change and fatigue. Negative for chills, fever and unexpected weight change.  HENT:   Negative for hearing loss and voice change.   Eyes: Negative for eye problems.  Respiratory: Negative for chest tightness and cough.   Cardiovascular: Negative for chest pain.  Gastrointestinal: Positive for abdominal pain. Negative for abdominal distention and blood in stool.  Endocrine: Negative for hot flashes.  Genitourinary: Negative for difficulty urinating and frequency.   Musculoskeletal: Negative for arthralgias.  Skin: Negative for itching and rash.  Neurological: Positive for numbness. Negative for extremity weakness.  Hematological: Negative for adenopathy.  Psychiatric/Behavioral: Negative for confusion.    MEDICAL HISTORY:  Past Medical History:  Diagnosis Date  . Anemia   . GERD (gastroesophageal reflux disease)   . History of hiatal hernia   . History of trichomonal vaginitis    07/2015  . Mental disorder    bipolar  . Nausea and vomiting during pregnancy     SURGICAL HISTORY: Past Surgical History:  Procedure Laterality Date  . GALLBLADDER SURGERY    . HERNIA REPAIR    . TUBAL LIGATION Bilateral 09/29/2015   Procedure: POST PARTUM TUBAL LIGATION;  Surgeon: Rubie Maid, MD;  Location: [facility] ORS;  Service: Gynecology;  Laterality: Bilateral;  . uterine ablasion      SOCIAL HISTORY: Social History   Socioeconomic History  . Marital  status: Single    Spouse name: Not on file  . Number of children: Not on file  . Years of education: Not on file  . Highest education level: Not on file  Occupational History  . Not on file  Tobacco Use  . Smoking status: Current Every Day Smoker    Packs/day: 1.00     Years: 20.00    Pack years: 20.00    Types: Cigarettes  . Smokeless tobacco: Never Used  Substance and Sexual Activity  . Alcohol use: No  . Drug use: No  . Sexual activity: Yes    Birth control/protection: None  Other Topics Concern  . Not on file  Social History Narrative  . Not on file   Social Determinants of Health   Financial Resource Strain:   . Difficulty of Paying Living Expenses: Not on file  Food Insecurity:   . Worried About Charity fundraiser in the Last Year: Not on file  . Ran Out of Food in the Last Year: Not on file  Transportation Needs:   . Lack of Transportation (Medical): Not on file  . Lack of Transportation (Non-Medical): Not on file  Physical Activity:   . Days of Exercise per Week: Not on file  . Minutes of Exercise per Session: Not on file  Stress:   . Feeling of Stress : Not on file  Social Connections:   . Frequency of Communication with Friends and Family: Not on file  . Frequency of Social Gatherings with Friends and Family: Not on file  . Attends Religious Services: Not on file  . Active Member of Clubs or Organizations: Not on file  . Attends Archivist Meetings: Not on file  . Marital Status: Not on file  Intimate Partner Violence:   . Fear of Current or Ex-Partner: Not on file  . Emotionally Abused: Not on file  . Physically Abused: Not on file  . Sexually Abused: Not on file    FAMILY HISTORY: Family History  Problem Relation Age of Onset  . Cancer Mother        ovarian, breast  . Diabetes Father   . Prostate cancer Father   . Diabetes Sister   . Kidney cancer Maternal Grandmother   . Pancreatic cancer Paternal Grandfather     ALLERGIES:  is allergic to morphine and related and orange fruit [citrus].  MEDICATIONS:  Current Outpatient Medications  Medication Sig Dispense Refill  . gabapentin (NEURONTIN) 300 MG capsule Take 300 mg by mouth 3 (three) times daily. 1 QAM, 2 QHS    . haloperidol decanoate (HALDOL  DECANOATE) 50 MG/ML injection Inject 100 mg into the muscle every 28 (twenty-eight) days.    . meloxicam (MOBIC) 15 MG tablet meloxicam 15 mg tablet  TAKE 1 TABLET BY MOUTH EVERY DAY    . risperiDONE (RISPERDAL) 0.5 MG tablet Take 0.5 mg by mouth 3 (three) times daily.    . sertraline (ZOLOFT) 100 MG tablet Take 100 mg by mouth daily. 2 tabs daily     No current facility-administered medications for this visit.     PHYSICAL EXAMINATION: ECOG PERFORMANCE STATUS: 1 - Symptomatic but completely ambulatory Vitals:   11/21/19 1108  BP: 123/85  Pulse: 86  Resp: 16  Temp: 98.2 F (36.8 C)   Filed Weights   11/21/19 1108  Weight: 262 lb 8 oz (119.1 kg)    Physical Exam Constitutional:      General: She is not in acute distress. HENT:  Head: Normocephalic and atraumatic.  Eyes:     General: No scleral icterus.    Pupils: Pupils are equal, round, and reactive to light.  Cardiovascular:     Rate and Rhythm: Normal rate and regular rhythm.     Heart sounds: Normal heart sounds.  Pulmonary:     Effort: Pulmonary effort is normal. No respiratory distress.     Breath sounds: No wheezing.  Abdominal:     General: Bowel sounds are normal. There is no distension.     Palpations: Abdomen is soft. There is no mass.     Tenderness: There is no abdominal tenderness.  Musculoskeletal:        General: No deformity. Normal range of motion.     Cervical back: Normal range of motion and neck supple.  Skin:    General: Skin is warm and dry.     Findings: No erythema or rash.  Neurological:     Mental Status: She is alert and oriented to person, place, and time.     Cranial Nerves: No cranial nerve deficit.     Coordination: Coordination normal.  Psychiatric:        Behavior: Behavior normal.        Thought Content: Thought content normal.     LABORATORY DATA:  I have reviewed the data as listed Lab Results  Component Value Date   WBC 6.5 11/21/2019   HGB 11.0 (L) 11/21/2019    HCT 37.9 11/21/2019   MCV 65.7 (L) 11/21/2019   PLT 379 11/21/2019   Recent Labs    11/21/19 1218  NA 138  K 4.2  CL 106  CO2 26  GLUCOSE 99  BUN 14  CREATININE 0.69  CALCIUM 8.8*  GFRNONAA >60  GFRAA >60  PROT 7.8  ALBUMIN 3.7  AST 25  ALT 23  ALKPHOS 158*  BILITOT 0.4   Iron/TIBC/Ferritin/ %Sat    Component Value Date/Time   IRON 45 11/21/2019 1218   IRON 63 08/18/2014 1035   TIBC 414 11/21/2019 1218   TIBC 350 08/18/2014 1035   FERRITIN 9 (L) 11/21/2019 1218   FERRITIN 5 (L) 08/10/2014 1238   IRONPCTSAT 11 11/21/2019 1218   IRONPCTSAT 18 08/18/2014 1035      RADIOGRAPHIC STUDIES: I have personally reviewed the radiological images as listed and agreed with the findings in the report.  MR ABDOMEN WWO CONTRAST  Result Date: 11/14/2019 CLINICAL DATA:  Evaluate kidney mass EXAM: MRI ABDOMEN WITHOUT AND WITH CONTRAST TECHNIQUE: Multiplanar multisequence MR imaging of the abdomen was performed both before and after the administration of intravenous contrast. CONTRAST:  52mL GADAVIST GADOBUTROL 1 MMOL/ML IV SOLN COMPARISON:  Abdominal sonogram 12//28/20 FINDINGS: Lower chest: No acute findings. Hepatobiliary: No mass or other parenchymal abnormality identified. The gallbladder is surgically absent. No biliary dilatation. Pancreas: No mass, inflammatory changes, or other parenchymal abnormality identified. Spleen:  Within normal limits in size and appearance. Adrenals/Urinary Tract: Normal appearance of the adrenal glands. Right kidney normal. No mass or hydronephrosis. There is a well circumscribed mass arising from the left mid kidney which measures 2.5 by 4.3 cm, image 25/5. This has a heterogeneous appearance in appears predominantly T2 hyperintense with internal solid areas of decreased T2 signal. Following the IV administration of contrast material there is diffuse heterogeneous enhancement within this mass. Stomach/Bowel: Visualized portions within the abdomen are  unremarkable. Vascular/Lymphatic: No pathologically enlarged lymph nodes identified. No abdominal aortic aneurysm demonstrated. Other:  None. Musculoskeletal: None IMPRESSION: 1. Enhancing mass arising  from the left mid kidney is compatible with renal cell carcinoma. 2. No evidence for metastatic disease within the upper abdomen. Electronically Signed   By: Kerby Moors M.D.   On: 11/14/2019 09:06   US Abdomen Complete  Result Date: 10/31/2019 CLINICAL DATA:  Elevated alkaline phosphatase. EXAM: ABDOMEN ULTRASOUND COMPLETE COMPARISON:  CT abdomen and pelvis 12/06/2014. FINDINGS: Gallbladder: Removed. Common bile duct: Diameter: 0.3 cm Liver: No focal lesion identified. Within normal limits in parenchymal echogenicity. Portal vein is patent on color Doppler imaging with normal direction of blood flow towards the liver. IVC: No abnormality visualized. Pancreas: Visualized portion unremarkable. Spleen: Size and appearance within normal limits. Right Kidney: Length: 10.2 cm. Echogenicity within normal limits. No mass or hydronephrosis visualized. Left Kidney: Length: 11.5 cm. There is a solid, round lesion in the lower pole measuring 4.5 x 4.1 x 4.6 cm. No hydronephrosis. Abdominal aorta: No aneurysm visualized. Other findings: None. IMPRESSION: Solid lesion lower pole left kidney is worrisome for renal cell carcinoma. MRI of the abdomen with contrast is recommended for further evaluation. The exam is otherwise negative. These results will be called to the ordering clinician or representative by the Radiologist Assistant, and communication documented in the PACS or zVision Dashboard. Electronically Signed   By: Inge Rise M.D.   On: 10/31/2019 10:03   DG Knee Complete 4 Views Left  Result Date: 10/31/2019 CLINICAL DATA:  Bilateral knee pain for 2 years, no known injury, initial encounter EXAM: LEFT KNEE - COMPLETE 4+ VIEW COMPARISON:  None. FINDINGS: Mild patellofemoral degenerative changes are noted.  No acute fracture or dislocation is seen. No joint effusion is noted. IMPRESSION: Mild degenerative change without acute abnormality. Electronically Signed   By: Inez Catalina M.D.   On: 10/31/2019 10:02   DG Knee Complete 4 Views Right  Result Date: 10/31/2019 CLINICAL DATA:  Right knee pain for several years, no known injury, initial encounter EXAM: RIGHT KNEE - COMPLETE 4+ VIEW COMPARISON:  None. FINDINGS: Mild patellofemoral degenerative changes are seen. No acute fracture or dislocation is noted. No joint effusion is noted. IMPRESSION: Mild degenerative change without acute abnormality. Electronically Signed   By: Inez Catalina M.D.   On: 10/31/2019 10:03     The insulin use ASSESSMENT & PLAN:  1. Kidney mass   2. Microcytic anemia   3. Other specified disorders of kidney and ureter   4. Other chronic pain    #Left kidney mass, T1b Images were independent reviewed by me discussed with patient and daughter. Patient has radiographic features compatible with RCC. I recommend patient to complete staging including CT chest with contrast and pelvis with contrast, bone scan-given that she has chronically elevated alkaline phosphatase. If no distant metastasis, patient will be referred to urology for discussion of resection/radical nephrectomy. Adjuvant treatment will be discussed after surgery.  #Microcytic anemia, chronic microcytosis.  Suspect underlying hemoglobinopathy vs iron deficiency anemia. Check iron panel. Labs are available after clinic visit. Patient has iron deficiency anemia with ferritin of 9. Advise patient to start ferrous sulfate 325mg  BID with meals.  Side effects and rationale discussed with patient.  She may take Colace 100 mg daily as needed for constipation. Hemoglobinopathy evaluation pending.  #Significant family history of ovarian cancer, breast cancer, pancreatic cancer, prostate cancer and RCC Refer patient to discuss with genetic counselor for genetic testing.   She agrees with the plan . #Chronic abdominal pain, lower extremity numbness tingling. Given the size of the kidney mass, it is less likely  that her chronic abdominal pain is related to kidney mass. Lower extremity numbness tingling, etiology unknown.  I will refer patient to establish care with palliative care service.  She agrees with the plan.   Orders Placed This Encounter  Procedures  . CT Chest W Contrast    Standing Status:   Future    Standing Expiration Date:   11/20/2020    Order Specific Question:   If indicated for the ordered procedure, I authorize the administration of contrast media per Radiology protocol    Answer:   Yes    Order Specific Question:   Is patient pregnant?    Answer:   Unknown (Please Explain)    Order Specific Question:   Preferred imaging location?    Answer:   [facility]    Order Specific Question:   Radiology Contrast Protocol - do NOT remove file path    Answer:   \\[email or web address].pdf  . NM Bone Scan Whole Body    Standing Status:   Future    Standing Expiration Date:   11/20/2020    Order Specific Question:   ** REASON FOR EXAM (FREE TEXT)    Answer:   RCC staging    Order Specific Question:   If indicated for the ordered procedure, I authorize the administration of a radiopharmaceutical per Radiology protocol    Answer:   Yes    Order Specific Question:   Is the patient pregnant?    Answer:   No    Order Specific Question:   Preferred imaging location?    Answer:   [facility]    Order Specific Question:   Radiology Contrast Protocol - do NOT remove file path    Answer:   \\[email or web address]\[email or web address]  . CBC with Differential/Platelet    Standing Status:   Future    Number of Occurrences:   1    Standing Expiration Date:   11/20/2020  . Comprehensive metabolic panel    Standing Status:   Future    Number of Occurrences:   1    Standing Expiration Date:   11/20/2020  . Iron and TIBC     Standing Status:   Future    Number of Occurrences:   1    Standing Expiration Date:   11/20/2020  . Ferritin    Standing Status:   Future    Number of Occurrences:   1    Standing Expiration Date:   11/20/2020  . Lactate dehydrogenase    Standing Status:   Future    Number of Occurrences:   1    Standing Expiration Date:   11/20/2020  . Ambulatory referral to Genetics    Referral Priority:   Routine    Referral Type:   Consultation    Referral Reason:   Specialty Services Required    Referred to Provider:   Faith Rogue T    Number of Visits Requested:   1  . Ambulatory Referral to Palliative Care    Referral Priority:   Routine    Referral Type:   Consultation    Referral Reason:   Symptom Managment    Referred to Provider:   Borders, Kirt Boys, NP    Number of Visits Requested:   1    All questions were answered. The patient knows to call the clinic with any problems questions or concerns.  Cc Aycock, Ngwe A, MD Return of visit: To be determined. Thank you for this kind referral and the opportunity to  participate in the care of this patient. A copy of today's note is routed to referring provider   Earlie Server, MD, PhD Hematology Oncology [facility] at [facility] Pager- [phone] 11/21/2019

## 2019-11-28 ENCOUNTER — Other Ambulatory Visit: Payer: Self-pay

## 2019-11-28 ENCOUNTER — Inpatient Hospital Stay (HOSPITAL_BASED_OUTPATIENT_CLINIC_OR_DEPARTMENT_OTHER): Payer: Medicaid Other | Admitting: Hospice and Palliative Medicine

## 2019-11-28 VITALS — BP 129/81 | HR 89 | Temp 97.2°F | Resp 18 | Wt 261.0 lb

## 2019-11-28 DIAGNOSIS — G8929 Other chronic pain: Secondary | ICD-10-CM

## 2019-11-28 DIAGNOSIS — G629 Polyneuropathy, unspecified: Secondary | ICD-10-CM

## 2019-11-28 DIAGNOSIS — F17210 Nicotine dependence, cigarettes, uncomplicated: Secondary | ICD-10-CM

## 2019-11-28 DIAGNOSIS — F259 Schizoaffective disorder, unspecified: Secondary | ICD-10-CM

## 2019-11-28 DIAGNOSIS — N2889 Other specified disorders of kidney and ureter: Secondary | ICD-10-CM | POA: Diagnosis not present

## 2019-11-28 DIAGNOSIS — Z79899 Other long term (current) drug therapy: Secondary | ICD-10-CM

## 2019-11-28 DIAGNOSIS — F329 Major depressive disorder, single episode, unspecified: Secondary | ICD-10-CM

## 2019-11-28 DIAGNOSIS — R109 Unspecified abdominal pain: Secondary | ICD-10-CM | POA: Diagnosis not present

## 2019-11-28 DIAGNOSIS — D509 Iron deficiency anemia, unspecified: Secondary | ICD-10-CM

## 2019-11-28 DIAGNOSIS — Z515 Encounter for palliative care: Secondary | ICD-10-CM | POA: Diagnosis not present

## 2019-11-28 DIAGNOSIS — K219 Gastro-esophageal reflux disease without esophagitis: Secondary | ICD-10-CM

## 2019-11-28 MED ORDER — NALOXONE HCL 4 MG/0.1ML NA LIQD: NASAL | 0 refills | Status: DC

## 2019-11-28 MED ORDER — TRAMADOL HCL 50 MG PO TABS: 50.0000 mg | ORAL_TABLET | Freq: Three times a day (TID) | ORAL | 0 refills | Status: DC | PRN

## 2019-11-28 NOTE — Progress Notes (Signed)
[place]  Telephone:([phone] [phone]   Name: Zoe Ramos Date: 11/28/2019 MRN: [id number]  DOB: 31-Mar-1976  Patient Care Team: [facility] as PCP - General (General Practice)    REASON FOR CONSULTATION: Zoe Ramos is a 44 y.o. female with multiple medical problems including IDA, tobacco abuse, schizoaffective disorder, and bipolar.  Patient was recently found to have a left kidney mass on abdominal MRI.  Imaging is consistent with RCC.  Patient is pending staging work-up for consideration of future treatment options.  She was referred to palliative care to address pain.  SOCIAL HISTORY:     reports that she has been smoking cigarettes. She has a 20.00 pack-year smoking history. She has never used smokeless tobacco. She reports that she does not drink alcohol or use drugs.   Patient is unmarried.  She has 7 children ranging from ages 22-4.  She lives at home with 2 of her children in an apartment.  Patient is currently disabled.  ADVANCE DIRECTIVES:  Does not have  CODE STATUS: Full code  PAST MEDICAL HISTORY: Past Medical History:  Diagnosis Date  . Anemia   . GERD (gastroesophageal reflux disease)   . History of hiatal hernia   . History of trichomonal vaginitis    07/2015  . Mental disorder    bipolar  . Nausea and vomiting during pregnancy     PAST SURGICAL HISTORY:  Past Surgical History:  Procedure Laterality Date  . GALLBLADDER SURGERY    . HERNIA REPAIR    . TUBAL LIGATION Bilateral 09/29/2015   Procedure: POST PARTUM TUBAL LIGATION;  Surgeon: Rubie Maid, MD;  Location: [facility] ORS;  Service: Gynecology;  Laterality: Bilateral;  . uterine ablasion      HEMATOLOGY/ONCOLOGY HISTORY:  Oncology History   No history exists.    ALLERGIES:  is allergic to morphine and related and orange fruit [citrus].  MEDICATIONS:  Current Outpatient Medications  Medication Sig  Dispense Refill  . docusate sodium (COLACE) 100 MG capsule Take 1 capsule (100 mg total) by mouth daily as needed for mild constipation or moderate constipation. Do not take if you have loose stools 60 capsule 2  . ferrous sulfate 325 (65 FE) MG EC tablet Take 1 tablet (325 mg total) by mouth 2 (two) times daily with a meal. 60 tablet 2  . gabapentin (NEURONTIN) 300 MG capsule Take 300 mg by mouth 3 (three) times daily. 1 QAM, 2 QHS    . haloperidol decanoate (HALDOL DECANOATE) 50 MG/ML injection Inject 100 mg into the muscle every 28 (twenty-eight) days.    . meloxicam (MOBIC) 15 MG tablet meloxicam 15 mg tablet  TAKE 1 TABLET BY MOUTH EVERY DAY    . risperiDONE (RISPERDAL) 0.5 MG tablet Take 0.5 mg by mouth 3 (three) times daily.    . sertraline (ZOLOFT) 100 MG tablet Take 100 mg by mouth daily. 2 tabs daily     No current facility-administered medications for this visit.    VITAL SIGNS: LMP 11/06/2019 (Exact Date)  There were no vitals filed for this visit.  Estimated body mass index is 39.1 kg/m as calculated from the following:   Height as of 11/21/19: 5' 8.7" (1.745 m).   Weight as of 11/21/19: 262 lb 8 oz (119.1 kg).  LABS: CBC:    Component Value Date/Time   WBC 6.5 11/21/2019 1218   HGB 11.0 (L) 11/21/2019 1218   HGB 10.0 (L) 08/09/2015  1135   HCT 37.9 11/21/2019 1218   HCT 30.4 (L) 08/09/2015 1135   PLT 379 11/21/2019 1218   PLT 420 12/06/2014 1943   MCV 65.7 (L) 11/21/2019 1218   MCV 63 (L) 12/06/2014 1943   NEUTROABS 3.7 11/21/2019 1218   NEUTROABS 6.1 12/06/2014 1943   LYMPHSABS 2.2 11/21/2019 1218   LYMPHSABS 1.9 12/06/2014 1943   MONOABS 0.4 11/21/2019 1218   MONOABS 0.9 12/06/2014 1943   EOSABS 0.1 11/21/2019 1218   EOSABS 0.2 12/06/2014 1943   BASOSABS 0.0 11/21/2019 1218   BASOSABS 0.1 12/06/2014 1943   Comprehensive Metabolic Panel:    Component Value Date/Time   NA 138 11/21/2019 1218   NA 139 12/06/2014 1943   K 4.2 11/21/2019 1218   K 3.5  12/06/2014 1943   CL 106 11/21/2019 1218   CL 106 12/06/2014 1943   CO2 26 11/21/2019 1218   CO2 27 12/06/2014 1943   BUN 14 11/21/2019 1218   BUN 5 (L) 12/06/2014 1943   CREATININE 0.69 11/21/2019 1218   CREATININE 0.93 12/06/2014 1943   GLUCOSE 99 11/21/2019 1218   GLUCOSE 86 12/06/2014 1943   CALCIUM 8.8 (L) 11/21/2019 1218   CALCIUM 8.8 12/06/2014 1943   AST 25 11/21/2019 1218   AST 28 08/10/2014 1238   ALT 23 11/21/2019 1218   ALT 19 08/10/2014 1238   ALKPHOS 158 (H) 11/21/2019 1218   ALKPHOS 140 (H) 08/10/2014 1238   BILITOT 0.4 11/21/2019 1218   BILITOT 0.6 08/10/2014 1238   PROT 7.8 11/21/2019 1218   PROT 8.8 (H) 08/10/2014 1238   ALBUMIN 3.7 11/21/2019 1218   ALBUMIN 3.7 08/10/2014 1238    RADIOGRAPHIC STUDIES: MR ABDOMEN WWO CONTRAST  Result Date: 11/14/2019 CLINICAL DATA:  Evaluate kidney mass EXAM: MRI ABDOMEN WITHOUT AND WITH CONTRAST TECHNIQUE: Multiplanar multisequence MR imaging of the abdomen was performed both before and after the administration of intravenous contrast. CONTRAST:  64m GADAVIST GADOBUTROL 1 MMOL/ML IV SOLN COMPARISON:  Abdominal sonogram 12//28/20 FINDINGS: Lower chest: No acute findings. Hepatobiliary: No mass or other parenchymal abnormality identified. The gallbladder is surgically absent. No biliary dilatation. Pancreas: No mass, inflammatory changes, or other parenchymal abnormality identified. Spleen:  Within normal limits in size and appearance. Adrenals/Urinary Tract: Normal appearance of the adrenal glands. Right kidney normal. No mass or hydronephrosis. There is a well circumscribed mass arising from the left mid kidney which measures 2.5 by 4.3 cm, image 25/5. This has a heterogeneous appearance in appears predominantly T2 hyperintense with internal solid areas of decreased T2 signal. Following the IV administration of contrast material there is diffuse heterogeneous enhancement within this mass. Stomach/Bowel: Visualized portions within the  abdomen are unremarkable. Vascular/Lymphatic: No pathologically enlarged lymph nodes identified. No abdominal aortic aneurysm demonstrated. Other:  None. Musculoskeletal: None IMPRESSION: 1. Enhancing mass arising from the left mid kidney is compatible with renal cell carcinoma. 2. No evidence for metastatic disease within the upper abdomen. Electronically Signed   By: TKerby MoorsM.D.   On: 11/14/2019 09:06   UKoreaAbdomen Complete  Result Date: 10/31/2019 CLINICAL DATA:  Elevated alkaline phosphatase. EXAM: ABDOMEN ULTRASOUND COMPLETE COMPARISON:  CT abdomen and pelvis 12/06/2014. FINDINGS: Gallbladder: Removed. Common bile duct: Diameter: 0.3 cm Liver: No focal lesion identified. Within normal limits in parenchymal echogenicity. Portal vein is patent on color Doppler imaging with normal direction of blood flow towards the liver. IVC: No abnormality visualized. Pancreas: Visualized portion unremarkable. Spleen: Size and appearance within normal limits. Right Kidney: Length:  10.2 cm. Echogenicity within normal limits. No mass or hydronephrosis visualized. Left Kidney: Length: 11.5 cm. There is a solid, round lesion in the lower pole measuring 4.5 x 4.1 x 4.6 cm. No hydronephrosis. Abdominal aorta: No aneurysm visualized. Other findings: None. IMPRESSION: Solid lesion lower pole left kidney is worrisome for renal cell carcinoma. MRI of the abdomen with contrast is recommended for further evaluation. The exam is otherwise negative. These results will be called to the ordering clinician or representative by the Radiologist Assistant, and communication documented in the PACS or zVision Dashboard. Electronically Signed   By: Inge Rise M.D.   On: 10/31/2019 10:03   DG Knee Complete 4 Views Left  Result Date: 10/31/2019 CLINICAL DATA:  Bilateral knee pain for 2 years, no known injury, initial encounter EXAM: LEFT KNEE - COMPLETE 4+ VIEW COMPARISON:  None. FINDINGS: Mild patellofemoral degenerative changes  are noted. No acute fracture or dislocation is seen. No joint effusion is noted. IMPRESSION: Mild degenerative change without acute abnormality. Electronically Signed   By: Inez Catalina M.D.   On: 10/31/2019 10:02   DG Knee Complete 4 Views Right  Result Date: 10/31/2019 CLINICAL DATA:  Right knee pain for several years, no known injury, initial encounter EXAM: RIGHT KNEE - COMPLETE 4+ VIEW COMPARISON:  None. FINDINGS: Mild patellofemoral degenerative changes are seen. No acute fracture or dislocation is noted. No joint effusion is noted. IMPRESSION: Mild degenerative change without acute abnormality. Electronically Signed   By: Inez Catalina M.D.   On: 10/31/2019 10:03    PERFORMANCE STATUS (ECOG) : 1 - Symptomatic but completely ambulatory  Review of Systems Unless otherwise noted, a complete review of systems is negative.  Physical Exam General: NAD, obese Pulmonary: Unlabored Abdomen: soft, nontender, + bowel sounds GU: no suprapubic tenderness, no CVA tenderness Extremities: no edema, no joint deformities Skin: no rashes Neurological: Grossly nonfocal, good sensation bilateral lower extremities  IMPRESSION: Met with patient today in the clinic.  Introduced palliative care services and attempted to establish therapeutic rapport.  Patient reports 10 out of 10 left-sided abdominal pain over the past 24 to 48 hours.  She denies nausea, vomiting, abdominal distention, fever, chills, constipation, diarrhea, or urinary symptoms.  After discussing with Dr. Tasia Catchings, it appears that abdominal pain has been more chronic in nature.  Additionally, patient has had several years of peripheral neuropathy of unclear etiology.  She has been prescribed gabapentin which patient says "does nothing to help."  Patient says that she has been on the current gabapentin dose for the past couple of years.  Patient does have chronic mental health problems including schizoaffective and bipolar.  She says she has regular  follow-up with outpatient psychiatry and had a telephone visit with them in the past couple weeks.  She reports stable mood and denies SI/HI.  Patient's subjective reports of pain seem disproportionate to objective findings.  She is pending staging work-up for the cancer but it is unclear at this point if her pain is related to the cancer or is more chronic in nature.  Her peripheral neuropathy certainly seems chronic and of unclear etiology.  Given her report that gabapentin lacks efficacy, we discussed possible nonpharmacological strategies for management including acupuncture and topical capsaicin.  Consideration could be given to increasing dose of gabapentin or rotating to pregabalin.  Additional adjuvant medications could be considered including duloxetine or amitriptyline but those medications would have to be used judiciously and in consultation with her psychiatrist.  We will provide patient  with a short course of tramadol as needed for pain.  She has taken tramadol in the past and reports good efficacy.  I did have a discussion with patient about the differences in neoplasm-related pain versus chronic pain.  It is unclear at present if we will be able to continue managing her pain in the cancer center and patient may require outpatient referral to pain management.  We will also plan referral to neurology for work-up of peripheral neuropathy.  May benefit from lumbar MRI and EMG studies.  PDMP reviewed.  Safe use and storage of opioids was discussed in detail.  Patient reports use of a locking medication container.  She was cautioned against driving while on pain medication. Will prescribe naloxone.   PLAN: -Ongoing staging workup with plan per Dr. Tasia Catchings -Tramadol '50mg'$  Q8H PRN for pain -Naloxone kit -Prophylactic bowel regimen -Referral to neurology for evaluation of chronic neuropathy/neuropathic pain -May need referral to pain management  Case and plan discussed with Dr. Tasia Catchings.   Time Total:  30 minutes  Visit consisted of counseling and education dealing with the complex and emotionally intense issues of symptom management and palliative care in the setting of serious and potentially life-threatening illness.Greater than 50%  of this time was spent counseling and coordinating care related to the above assessment and plan.  Signed by: Altha Harm, PhD, NP-C

## 2019-11-29 ENCOUNTER — Encounter
Admission: RE | Admit: 2019-11-29 | Discharge: 2019-11-29 | Disposition: A | Discharge status: Home / Self Care | Payer: Medicaid Other | Source: Ambulatory Visit | Attending: Oncology | Admitting: Oncology

## 2019-11-29 ENCOUNTER — Encounter: Payer: Self-pay | Admitting: Radiology

## 2019-11-29 DIAGNOSIS — N2889 Other specified disorders of kidney and ureter: Secondary | ICD-10-CM

## 2019-11-29 MED ORDER — TECHNETIUM TC 99M MEDRONATE IV KIT
20.0000 | PACK | Freq: Once | INTRAVENOUS | Status: AC | PRN
  Administered 2019-11-29: 11:00:00 22.384 via INTRAVENOUS

## 2019-12-02 ENCOUNTER — Other Ambulatory Visit: Payer: Self-pay

## 2019-12-02 ENCOUNTER — Ambulatory Visit
Admission: RE | Admit: 2019-12-02 | Discharge: 2019-12-02 | Disposition: A | Discharge status: Home / Self Care | Payer: Medicaid Other | Source: Ambulatory Visit | Attending: Oncology | Admitting: Oncology

## 2019-12-02 DIAGNOSIS — D509 Iron deficiency anemia, unspecified: Secondary | ICD-10-CM | POA: Insufficient documentation

## 2019-12-02 DIAGNOSIS — N2889 Other specified disorders of kidney and ureter: Secondary | ICD-10-CM | POA: Insufficient documentation

## 2019-12-02 MED ORDER — IOHEXOL 350 MG/ML SOLN
100.0000 mL | Freq: Once | INTRAVENOUS | Status: AC | PRN
  Administered 2019-12-02: 100 mL via INTRAVENOUS

## 2019-12-05 ENCOUNTER — Telehealth: Payer: Self-pay | Admitting: Oncology

## 2019-12-05 ENCOUNTER — Other Ambulatory Visit: Payer: Self-pay

## 2019-12-05 DIAGNOSIS — N2889 Other specified disorders of kidney and ureter: Secondary | ICD-10-CM

## 2019-12-05 DIAGNOSIS — R590 Localized enlarged lymph nodes: Secondary | ICD-10-CM

## 2019-12-05 NOTE — Telephone Encounter (Signed)
CT scan, bone survey were discussed with patient. I referred patient to urology for further evaluation of nephrectomy. Borderline axillary lymph node and inguinal lymph node, questionable reactive. Patient has never had a mammogram done before now obtained.

## 2019-12-06 ENCOUNTER — Telehealth: Payer: Self-pay | Admitting: Urology

## 2019-12-06 NOTE — Telephone Encounter (Signed)
-----   Message from Billey Co, MD sent at 12/05/2019 11:36 AM EST ----- Regarding: renal mass Thanks, looks like she needs a nephrectomy. We'll get her in quickly  [place]- [place] to [facility] in the next 2 weeks if needed  Nickolas Madrid, MD 12/05/2019  ----- Message ----- From: Earlie Server, MD Sent: 12/05/2019  11:20 AM EST To: Billey Co, MD, Evelina Dun, RN  Dear Dr.Sninsky,  I am referring this patient to you to establish care for evaluation of left kidney mass. Her staging CT showed borderline enlarged LN, bilateral axillary and subpectoral lymph, bilateral inguinal lymph nodes. She never had mammogram done. I will obtain that as well. If suspicious, I will get axillary LN biopsy too.  Thank  you  Benjamine Mola, please send a referral to Dr.Sninsky, and also schedule her to have bilateral diagnostic mammogram- bilaterally axillary lymphadenopathy. Thanks. Patient is aware about the referral and MM. Thanks.    Earlie Server

## 2019-12-06 NOTE — Telephone Encounter (Signed)
Done, left message for pt and mailed appt reminder

## 2019-12-07 ENCOUNTER — Telehealth: Payer: Self-pay | Admitting: *Deleted

## 2019-12-07 ENCOUNTER — Telehealth: Payer: Self-pay

## 2019-12-07 ENCOUNTER — Ambulatory Visit
Admission: RE | Admit: 2019-12-07 | Discharge: 2019-12-07 | Disposition: A | Discharge status: Home / Self Care | Payer: Medicaid Other | Source: Ambulatory Visit | Attending: Oncology | Admitting: Oncology

## 2019-12-07 DIAGNOSIS — R590 Localized enlarged lymph nodes: Secondary | ICD-10-CM

## 2019-12-07 NOTE — Telephone Encounter (Signed)
Patient called and wanted J Borders to know that she has been on Percocet in the past.

## 2019-12-07 NOTE — Telephone Encounter (Signed)
Billey Chang, NP would like for patient to be referred to pain clinic in [place]:  [facility] or [facility] (both accept her insurance).  Will send records for referral to: [facility] Phone:  [phone] Fax:  [phone] Lessie Dings (NP coordinator)  Patient is aware and will expect a call regarding appt. for pain clinic.

## 2019-12-07 NOTE — Telephone Encounter (Signed)
Discussed with Dr. Tasia Catchings. Will send referral for pain management.

## 2019-12-12 NOTE — Telephone Encounter (Signed)
Call received from [place] at [facility].  Patient has an appt on 12/20/2019 @ 2:00 with Lucky Cowboy, NP.

## 2019-12-12 NOTE — Telephone Encounter (Signed)
Left message at pain clinic to check status of referral.

## 2019-12-16 ENCOUNTER — Telehealth: Payer: Self-pay

## 2019-12-16 NOTE — Telephone Encounter (Signed)
Dr. Doristine Counter office contacted pt to set up new pt app, but patient stated that she didn't know why she was being referred there and was upset with their office. This nurse called patient and explained why she was referred there. Pt agrees to follow up with them. I have contacted Tammy at Dr. Doristine Counter office to notify her that pt will proceed with appt.

## 2019-12-21 ENCOUNTER — Telehealth: Payer: Self-pay | Admitting: Urology

## 2019-12-21 ENCOUNTER — Ambulatory Visit: Payer: Self-pay | Admitting: Urology

## 2019-12-21 NOTE — Telephone Encounter (Signed)
S/W pt and she r/s for 2/24 @ 9

## 2019-12-21 NOTE — Telephone Encounter (Signed)
-----   Message from Billey Co, MD sent at 12/21/2019 11:08 AM EST ----- Regarding: follow up Patient was a no-show today on my schedule and she has a large kidney tumor that needs surgery.  Please offer to reschedule her within the next 2 weeks and okay to overbook.  Nickolas Madrid, MD 12/21/2019

## 2019-12-28 ENCOUNTER — Encounter: Payer: Self-pay | Admitting: Urology

## 2019-12-28 ENCOUNTER — Ambulatory Visit: Payer: Medicaid Other | Admitting: Urology

## 2019-12-30 NOTE — Telephone Encounter (Signed)
I left message on pt's v/m.  Zoe Ramos will also send a certified letter.

## 2020-01-04 NOTE — Progress Notes (Signed)
Letter mailed

## 2020-01-11 ENCOUNTER — Ambulatory Visit: Payer: Medicaid Other | Admitting: Urology

## 2020-01-19 ENCOUNTER — Ambulatory Visit (INDEPENDENT_AMBULATORY_CARE_PROVIDER_SITE_OTHER): Payer: Medicaid Other | Admitting: Urology

## 2020-01-19 ENCOUNTER — Encounter: Payer: Self-pay | Admitting: Urology

## 2020-01-19 ENCOUNTER — Other Ambulatory Visit: Payer: Self-pay

## 2020-01-19 ENCOUNTER — Other Ambulatory Visit: Payer: Self-pay | Admitting: Radiology

## 2020-01-19 VITALS — BP 111/80 | HR 97 | Ht 67.0 in | Wt 265.1 lb

## 2020-01-19 DIAGNOSIS — N2889 Other specified disorders of kidney and ureter: Secondary | ICD-10-CM | POA: Diagnosis not present

## 2020-01-19 LAB — MICROSCOPIC EXAMINATION

## 2020-01-19 LAB — URINALYSIS, COMPLETE
Bilirubin, UA: NEGATIVE
Glucose, UA: NEGATIVE
Ketones, UA: NEGATIVE
Nitrite, UA: POSITIVE — AB
Specific Gravity, UA: 1.025 (ref 1.005–1.030)
Urobilinogen, Ur: 0.2 mg/dL (ref 0.2–1.0)
pH, UA: 5 (ref 5.0–7.5)

## 2020-01-19 NOTE — Patient Instructions (Signed)
Kidney Cancer  Kidney cancer is an abnormal growth of cells in one or both kidneys. The kidneys filter waste from your blood and produce urine. Kidney cancer may spread to other parts of your body. This type of cancer may also be called renal cell carcinoma. What are the causes? The cause of this condition is not always known. In some cases, abnormal changes to genes (genetic mutations) can cause cells to form cancer. What increases the risk? You may be more likely to develop kidney cancer if you:  Are over age 60. The risk increases with age.  Have a family history of kidney cancer.  Are of African-American, Native American, or Native [place] descent.  Smoke.  Are female.  Are obese.  Have high blood pressure (hypertension).  Have advanced kidney disease, especially if you need long-term dialysis.  Have certain conditions that are passed from parent to child (inherited), such as von Hippel-Lindau disease, tuberous sclerosis, or hereditary papillary renal carcinoma.  Have been exposed to certain chemicals. What are the signs or symptoms? In the early stages, kidney cancer does not cause symptoms. As the cancer grows, symptoms may include:  Blood in the urine.  Pain in the upper back or abdomen, just below the rib cage. You may feel pain on one or both sides of the body.  Fatigue.  Unexplained weight loss.  Fever. How is this diagnosed? This condition may be diagnosed based on:  Your symptoms and medical history.  A physical exam.  Blood and urine tests.  X-rays.  Imaging tests, such as CT scans, MRIs, and PET scans.  Having dye injected into your blood through an IV, and then having X-rays taken of: ? Your kidneys and the rest of the organs involved in making and storing urine (intravenous pyelogram). ? Your blood vessels (angiogram).  Removal and testing of a kidney tissue sample (biopsy). Your cancer will be assessed (staged), based on how severe it is and how  much it has spread. How is this treated? Treatment depends on the type and stage of the cancer. Treatment may include one or more of the following:  Surgery. This may include surgery to remove: ? Just the tumor (nephron-sparing surgery). ? The entire kidney (nephrectomy). ? The kidney, some of the surrounding healthy tissue, nearby lymph nodes, and the adrenal gland in certain cases (radical nephrectomy).  Medicines that kill cancer cells (chemotherapy).  High-energy rays that kill cancer cells (radiation therapy).  Targeted therapy. This targets specific parts of cancer cells and the area around them to block the growth and the spread of the cancer. Targeted therapy can help to limit the damage to healthy cells.  Medicines that help your body's disease-fighting system (immune system) fight cancer cells (immunotherapy).  Freezing cancer cells using gas or liquid that is delivered through a needle (cryoablation).  Destroying cancer cells using high-energy radio waves that are delivered through a needle-like probe (radiofrequency ablation).  A procedure to block the artery that supplies blood to the tumor, which kills the cancer cells (embolization). Follow these instructions at home: Eating and drinking  Some of your treatments might affect your appetite and your ability to chew and swallow. If you are having problems eating, or if you do not have an appetite, meet with a diet and nutrition specialist (dietitian).  If you have side effects that affect eating, it may help to: ? Eat smaller meals and snacks often. ? Drink high-nutrition and high-calorie shakes or supplements. ? Eat bland and soft   foods that are easy to eat. ? Not eat foods that are hot, spicy, or hard to swallow. Lifestyle  Do not drink alcohol.  Do not use any products that contain nicotine or tobacco, such as cigarettes and e-cigarettes. If you need help quitting, ask your health care provider. General  instructions   Take over-the-counter and prescription medicines only as told by your health care provider. This includes vitamins, supplements, and herbal products.  Consider joining a support group to help you cope with the stress of having kidney cancer.  Work with your health care provider to manage any side effects of treatment.  Keep all follow-up visits as told by your health care provider. This is important. Where to find more information  American Cancer Society: https://www.cancer.org  National Cancer Institute (NCI): https://www.cancer.gov Contact a health care provider if you:  Notice that you bruise or bleed easily.  Are losing weight without trying.  Have new or increased fatigue or weakness. Get help right away if you have:  Blood in your urine.  A sudden increase in pain.  A fever.  Shortness of breath.  Chest pain.  Yellow skin or whites of your eyes (jaundice). Summary  Kidney cancer is an abnormal growth of cells (tumor) in one or both kidneys. Tumors may spread to other parts of your body.  In the early stages, kidney cancer does not cause symptoms. As the cancer grows, symptoms may include blood in the urine, pain in the upper back or abdomen, unexplained weight loss, fatigue, and fever.  Treatment depends on the type and stage of the cancer. It may include surgery to remove the tumor, procedures and medicines to kill the cancer cells, or medicines to help your body fight cancer cells. This information is not intended to replace advice given to you by your health care provider. Make sure you discuss any questions you have with your health care provider. Document Revised: 11/11/2017 Document Reviewed: 11/08/2017 Elsevier Patient Education  2020 Elsevier Inc.   Minimally Invasive Nephrectomy Minimally invasive nephrectomy is a surgical procedure to remove a kidney. This procedure is done through several small incisions in the abdomen. You may have  surgery to remove:  The entire kidney and some surrounding structures (radical nephrectomy).  Only the damaged or diseased part of the kidney (partial nephrectomy). You may need this surgery if:  Your kidney is severely damaged from kidney disease, infection, or cancer.  You were born with an abnormal kidney.  You are donating a healthy kidney. Tell a health care provider about:  Any allergies you have.  All medicines you are taking, including vitamins, herbs, eye drops, creams, and over-the-counter medicines.  Any problems you or family members have had with anesthetic medicines.  Any blood disorders you have.  Any surgeries you have had.  Any medical conditions you have.  Whether you are pregnant or may be pregnant. What are the risks? Generally, this is a safe procedure. However, problems may occur, including:  Bleeding.  Infection.  Pneumonia.  Damage to other body structures near the kidney.  Allergic reactions to medicines. A bone (usually part of a rib) or more tissue may need to be removed so the surgeon can get to the kidney. If this happens, the minimally invasive procedure may need to be changed to an open procedure. What happens before the procedure? Staying hydrated Follow instructions from your health care provider about hydration, which may include:  Up to 2 hours before the procedure - you may continue   to drink clear liquids, such as water, clear fruit juice, black coffee, and plain tea.  Eating and drinking restrictions Follow instructions from your health care provider about eating and drinking, which may include:  8 hours before the procedure - stop eating heavy meals or foods, such as meat, fried foods, or fatty foods.  6 hours before the procedure - stop eating light meals or foods, such as toast or cereal.  6 hours before the procedure - stop drinking milk or drinks that contain milk.  2 hours before the procedure - stop drinking clear  liquids. Medicines Ask your health care provider about:  Changing or stopping your regular medicines. This is especially important if you are taking diabetes medicines or blood thinners.  Taking medicines such as aspirin and ibuprofen. These medicines can thin your blood. Do not take these medicines unless your health care provider tells you to take them.  Taking over-the-counter medicines, vitamins, herbs, and supplements. General instructions  Plan to have someone take you home from the hospital or clinic.  Do not use any products that contain nicotine or tobacco for at least 4 weeks before the procedure. These products include cigarettes, e-cigarettes, and chewing tobacco. If you need help quitting, ask your health care provider.  Ask your health care provider: ? How your surgery site will be marked. ? What steps will be taken to help prevent infection. These may include:  Removing hair at the surgery site.  Washing skin with a germ-killing soap.  Taking antibiotic medicine. What happens during the procedure?     Before surgery begins  An IV will be inserted into one of your veins.  You will be given a medicine to make you fall asleep (general anesthetic).  Tight-fitting stockings (compression stockings) may be placed on your legs to help blood flow.  A small, thin tube will be placed in your bladder (urinary catheter) to drain urine.  A tube will be placed through your nose and into your stomach (nasogastric tube) to drain stomach fluids. Surgery  A small incision will be made in your abdomen to insert a thin, lighted tube (laparoscope) with a camera attached. This lets your surgeon see your kidney during the procedure.  More small incisions will be made to insert other surgical tools. One incision may be slightly larger to remove your kidney. ? In some cases, the surgeon will do the procedure by controlling tools that are attached to a surgical robot. This is called a  robot-assisted nephrectomy.  Depending on the type of procedure you are having, one of the following will be done: ? If you are having a partial nephrectomy:  The blood vessels attached to your kidney will be clamped.  Part of your kidney will be removed. The kidney will be closed with stitches (sutures), and the clamp on the blood vessels will be removed. ? If you are having a radical nephrectomy:  All of the blood vessels that attach to your kidney will be closed and cut.  Part of the tube that carries urine from your kidney to your bladder (ureter) will be removed.  Your kidney will be removed.  All of the surgical tools will be removed from your body.  A small tube (drain) may be placed near one of the incisions to drain extra fluid from the surgical area.  The incisions may be closed with sutures or another type of closure.  A bandage (dressing) will be placed over the incision area. The procedure may vary   among health care providers and hospitals. What happens after the procedure?  Your blood pressure, heart rate, breathing rate, and blood oxygen level will be monitored until you leave the hospital or clinic.  Your nasogastric tube will be removed.  You may continue to have: ? An IV until you can drink fluids on your own. ? A urinary catheter. Your urine output will be checked. ? A drain. Your surgical drain will be removed after a few days. Your health care provider will instruct you how to care for the drain at home. ? Pain medicine. This will be given through your IV.  You will be encouraged to: ? Get out of bed and walk as soon as possible. ? Do breathing exercises, such as coughing and breathing deeply. This helps prevent pneumonia. Summary  Minimally invasive nephrectomy is a surgical procedure to remove a kidney. In some cases, only the damaged or diseased part of the kidney is removed.  This procedure is done through several small incisions in the  abdomen.  Follow instructions from your health care provider about taking medicines and about eating and drinking before the procedure.  You will be given a medicine to make you fall asleep (general anesthetic) during the procedure. This information is not intended to replace advice given to you by your health care provider. Make sure you discuss any questions you have with your health care provider. Document Revised: 11/16/2018 Document Reviewed: 11/16/2018 Elsevier Patient Education  2020 Elsevier Inc.   

## 2020-01-19 NOTE — Progress Notes (Signed)
01/19/20 12:23 PM   Zoe Ramos 09/29/1976 [id number]  CC: Left renal mass  HPI: I saw Ms. Zoe Ramos in urology clinic for an enhancing left renal mass. She is a 44 year old female with history of bipolar disorder who was recently found to have an elevated alkaline phosphatase and underwent a renal ultrasound. This showed no liver abnormalities, however there was a 4 cm solid left lower pole kidney lesion. A follow-up MRI for better evaluation on 11/14/2019 showed a 4.3 cm enhancing endophytic mass in the left mid and lower kidney consistent with RCC. A follow-up chest CT to evaluate for metastatic disease showed some borderline prominent axillary and subpectoral lymph nodes, but were felt highly unlikely to represent metastatic disease. She has no showed 3 separate appointments with [place] for discussion of this renal mass, and we were finally able to get her into clinic today.  She denies any weight loss, dysuria, or urinary symptoms. She denies any gross hematuria. She reports some chronic left-sided flank pain. Her past surgical history is notable for a laparoscopic cholecystectomy, umbilical hernia repair with mesh, as well as a hiatal hernia repair, and tubal ligation  She has a 20-pack-year smoking history, she reports she has cut down to 4 to 5 cigarettes a day.  PMH: Past Medical History:  Diagnosis Date  . Anemia   . GERD (gastroesophageal reflux disease)   . History of hiatal hernia   . History of trichomonal vaginitis    07/2015  . Mental disorder    bipolar  . Nausea and vomiting during pregnancy     Surgical History: Past Surgical History:  Procedure Laterality Date  . GALLBLADDER SURGERY    . HERNIA REPAIR    . TUBAL LIGATION Bilateral 09/29/2015   Procedure: POST PARTUM TUBAL LIGATION;  Surgeon: Rubie Maid, MD;  Location: [facility] ORS;  Service: Gynecology;  Laterality: Bilateral;  . uterine ablasion      Family History: Family History  Problem Relation Age of Onset  .  Cancer Mother        ovarian, breast  . Breast cancer Mother   . Diabetes Father   . Prostate cancer Father   . Diabetes Sister   . Kidney cancer Maternal Grandmother   . Pancreatic cancer Paternal Grandfather     Social History:  reports that she has been smoking cigarettes. She has a 20.00 pack-year smoking history. She has never used smokeless tobacco. She reports that she does not drink alcohol or use drugs.  Physical Exam: BP 111/80 (BP Location: Left Arm, Patient Position: Sitting, Cuff Size: Large)   Pulse 97   Ht _0  (1.702 m)   Wt 265 lb 1.6 oz (120.2 kg)   BMI 41.52 kg/m    Constitutional:  Alert and oriented, No acute distress. Cardiovascular: Regular rate and rhythm Respiratory: Clear to auscultation bilaterally GI: Abdomen is soft, nontender, numerous abdominal scars GU: No CVA tenderness Lymph: No cervical or inguinal lymphadenopathy. Skin: No rashes, bruises or suspicious lesions. Neurologic: Grossly intact, no focal deficits, moving all 4 extremities. Psychiatric: Normal mood and affect.  Laboratory Data: Reviewed sCr 0.69, eGFR >60 LDH 131  Pertinent Imaging: I have personally reviewed the CT chest and MR abdomen from 11/2019. No evidence of metastatic disease. Left enhancing 4.5cm central endophytic mid and lower pole renal mass worrisome for RCC.  Assessment & Plan:   In summary, she is a 44 year old female with bipolar disorder and an enhancing left endophytic 4.5 cm central enhancing renal mass  consistent with renal cell carcinoma.  There is no evidence of metastatic disease on chest CT.  She has normal renal function.  We had a long conversation about kidney tumors and the high likelihood that this represents a renal malignancy.  We discussed treatment options at length including laparoscopic radical nephrectomy, robotic partial nephrectomy, and ablation.  With her extensive surgical history, very endophytic central and large tumor, I think she would be  a much better candidate for a laparoscopic radical nephrectomy.  We discussed the risks and benefits at length.  We discussed a 1-2 night hospitalization with risks of bleeding, infection, damage to surround structures, possible bowel injury, postoperative pain, and most importantly potential risk of recurrence in the future necessitating close surveillance imaging.  Finally, we also discussed the possible need for adjuvant treatment in the future with medical oncology with immunotherapy to potentially improve disease-free survival.  Schedule left hand-assisted laparoscopic radical nephrectomy  Nickolas Madrid, MD 01/19/2020  [facility] [address], [place], [place] [address] [phone]

## 2020-02-01 ENCOUNTER — Other Ambulatory Visit: Payer: Self-pay | Admitting: *Deleted

## 2020-02-01 DIAGNOSIS — N2889 Other specified disorders of kidney and ureter: Secondary | ICD-10-CM

## 2020-02-02 ENCOUNTER — Other Ambulatory Visit: Payer: Self-pay

## 2020-02-02 ENCOUNTER — Other Ambulatory Visit: Payer: Medicaid Other

## 2020-02-02 ENCOUNTER — Telehealth: Payer: Self-pay | Admitting: Urology

## 2020-02-02 DIAGNOSIS — N2889 Other specified disorders of kidney and ureter: Secondary | ICD-10-CM

## 2020-02-02 LAB — URINALYSIS, COMPLETE
Bilirubin, UA: NEGATIVE
Glucose, UA: NEGATIVE
Ketones, UA: NEGATIVE
Nitrite, UA: POSITIVE — AB
Specific Gravity, UA: >1.03 — ABNORMAL HIGH (ref 1.005–1.030)
Urobilinogen, Ur: 0.2 mg/dL (ref 0.2–1.0)
pH, UA: 5.5 (ref 5.0–7.5)

## 2020-02-02 LAB — MICROSCOPIC EXAMINATION: WBC, UA: >30 /hpf — AB (ref 0–5)

## 2020-02-02 MED ORDER — SULFAMETHOXAZOLE-TRIMETHOPRIM 800-160 MG PO TABS: 1.0000 | ORAL_TABLET | Freq: Two times a day (BID) | ORAL | 0 refills | Status: DC

## 2020-02-02 NOTE — Telephone Encounter (Signed)
Pt came into office to leave a pre-op urine sample.  She is drinking lots of water and having a hard time.  She said she feels like she has  A UTI.  She said she has urgency, but then can't go.

## 2020-02-03 ENCOUNTER — Encounter
Admission: RE | Admit: 2020-02-03 | Discharge: 2020-02-03 | Disposition: A | Discharge status: Home / Self Care | Payer: Medicaid Other | Source: Ambulatory Visit | Attending: Urology | Admitting: Urology

## 2020-02-03 ENCOUNTER — Other Ambulatory Visit: Payer: Self-pay

## 2020-02-03 DIAGNOSIS — Z01818 Encounter for other preprocedural examination: Secondary | ICD-10-CM | POA: Diagnosis not present

## 2020-02-03 HISTORY — DX: Malignant (primary) neoplasm, unspecified: C80.1

## 2020-02-03 NOTE — Patient Instructions (Signed)
Your procedure is scheduled on: 02-13-20 [facility] Report to Same Day Surgery 2nd floor medical mall [facility] Entrance-take elevator on left to 2nd floor.  Check in with surgery information desk.) To find out your arrival time please call [phone] between 1PM - 3PM on 02-10-20 FRIDAY  Remember: Instructions that are not followed completely may result in serious medical risk, up to and including death, or upon the discretion of your surgeon and anesthesiologist your surgery may need to be rescheduled.    _x___ 1. Do not eat food after midnight the night before your procedure. NO GUM OR CANDY AFTER MIDNIGHT. You may drink clear liquids up to 2 hours before you are scheduled to arrive at the hospital for your procedure.  Do not drink clear liquids within 2 hours of your scheduled arrival to the hospital.  Clear liquids include  --Water or Apple juice without pulp  --Gatorade  --Black Coffee or Clear Tea (No milk, no creamers, do not add anything to the coffee or Tea   ____Ensure clear carbohydrate drink on the way to the hospital for bariatric patients  ____Ensure clear carbohydrate drink 3 hours before surgery.   __x__ 2. No Alcohol for 24 hours before or after surgery.   __x__3. No Smoking or e-cigarettes for 24 prior to surgery.  Do not use any chewable tobacco products for at least 6 hour prior to surgery   ____  4. Bring all medications with you on the day of surgery if instructed.    __x__ 5. Notify your doctor if there is any change in your medical condition     (cold, fever, infections).    x___6. On the morning of surgery brush your teeth with toothpaste and water.  You may rinse your mouth with mouth wash if you wish.  Do not swallow any toothpaste or mouthwash.   Do not wear jewelry, make-up, hairpins, clips or nail polish.  Do not wear lotions, powders, or perfumes.   Do not shave 48 hours prior to surgery. Men may shave face and neck.  Do not bring valuables to the  hospital.    [facility] is not responsible for any belongings or valuables.               Contacts, dentures or bridgework may not be worn into surgery.  Leave your suitcase in the car. After surgery it may be brought to your room.  For patients admitted to the hospital, discharge time is determined by your treatment team.  _  Patients discharged the day of surgery will not be allowed to drive home.  You will need someone to drive you home and stay with you the night of your procedure.    Please read over the following fact sheets that you were given:   [facility] Preparing for Surgery   _x___ TAKE THE FOLLOWING MEDICATION THE MORNING OF SURGERY WITH A SMALL SIP OF WATER. These include:  1. ZOLOFT (SERTRALINE)  2. YOU MAY TAKE TRAMADOL DAY OF SURGERY IF NEEDED  3.  4.  5.  6.  ____Fleets enema or Magnesium Citrate as directed.   _x___ Use CHG Soap or sage wipes as directed on instruction sheet   ____ Use inhalers on the day of surgery and bring to hospital day of surgery  ____ Stop Metformin and Janumet 2 days prior to surgery.    ____ Take 1/2 of usual insulin dose the night before surgery and none on the morning surgery.   ____  Follow recommendations from Cardiologist, Pulmonologist or PCP regarding stopping Aspirin, Coumadin, Plavix ,Eliquis, Effient, or Pradaxa, and Pletal.  X____Stop Anti-inflammatories such as Advil, Aleve, Ibuprofen, Motrin, Naproxen, Naprosyn, Goodies powders or aspirin products 7 DAYS PRIOR TO SURGERY- OK to take Tylenol OR TRAMADOL IF NEEDED  ____ Stop supplements until after surgery.   ____ Bring C-Pap to the hospital.

## 2020-02-06 LAB — CULTURE, URINE COMPREHENSIVE

## 2020-02-09 ENCOUNTER — Other Ambulatory Visit
Admission: RE | Admit: 2020-02-09 | Discharge: 2020-02-09 | Disposition: A | Discharge status: Home / Self Care | Payer: Medicaid Other | Source: Ambulatory Visit | Attending: Urology | Admitting: Urology

## 2020-02-09 DIAGNOSIS — Z01812 Encounter for preprocedural laboratory examination: Secondary | ICD-10-CM | POA: Diagnosis present

## 2020-02-09 DIAGNOSIS — Z20822 Contact with and (suspected) exposure to covid-19: Secondary | ICD-10-CM | POA: Diagnosis not present

## 2020-02-09 LAB — SARS CORONAVIRUS 2 (TAT 6-24 HRS): SARS Coronavirus 2: NEGATIVE

## 2020-02-09 LAB — BASIC METABOLIC PANEL
Anion gap: 6 (ref 5–15)
BUN: 8 mg/dL (ref 6–20)
CO2: 26 mmol/L (ref 22–32)
Calcium: 8.9 mg/dL (ref 8.9–10.3)
Chloride: 105 mmol/L (ref 98–111)
Creatinine, Ser: 0.87 mg/dL (ref 0.44–1.00)
GFR calc Af Amer: >60 mL/min (ref >60)
GFR calc non Af Amer: >60 mL/min (ref >60)
Glucose, Bld: 111 mg/dL — ABNORMAL HIGH (ref 70–99)
Potassium: 3.9 mmol/L (ref 3.5–5.1)
Sodium: 137 mmol/L (ref 135–145)

## 2020-02-09 LAB — PROTIME-INR
INR: 1 (ref 0.8–1.2)
Prothrombin Time: 12.8 seconds (ref 11.4–15.2)

## 2020-02-09 LAB — CBC
HCT: 38.5 % (ref 36.0–46.0)
Hemoglobin: 11.8 g/dL — ABNORMAL LOW (ref 12.0–15.0)
MCH: 19.2 pg — ABNORMAL LOW (ref 26.0–34.0)
MCHC: 30.6 g/dL (ref 30.0–36.0)
MCV: 62.8 fL — ABNORMAL LOW (ref 80.0–100.0)
Platelets: 385 10*3/uL (ref 150–400)
RBC: 6.13 MIL/uL — ABNORMAL HIGH (ref 3.87–5.11)
RDW: 18.5 % — ABNORMAL HIGH (ref 11.5–15.5)
WBC: 8 10*3/uL (ref 4.0–10.5)
nRBC: 0 % (ref 0.0–0.2)

## 2020-02-13 ENCOUNTER — Encounter
Admission: RE | Disposition: A | Discharge status: Home / Self Care | Payer: Self-pay | Source: Home / Self Care | Attending: Urology

## 2020-02-13 ENCOUNTER — Inpatient Hospital Stay: Payer: Medicaid Other | Admitting: Anesthesiology

## 2020-02-13 ENCOUNTER — Encounter: Payer: Self-pay | Admitting: Urology

## 2020-02-13 ENCOUNTER — Other Ambulatory Visit: Payer: Self-pay

## 2020-02-13 ENCOUNTER — Ambulatory Visit
Admission: RE | Admit: 2020-02-13 | Discharge: 2020-02-13 | Disposition: A | Discharge status: Home / Self Care | Payer: Medicaid Other | Attending: Urology | Admitting: Urology

## 2020-02-13 DIAGNOSIS — Z539 Procedure and treatment not carried out, unspecified reason: Secondary | ICD-10-CM | POA: Insufficient documentation

## 2020-02-13 DIAGNOSIS — N2889 Other specified disorders of kidney and ureter: Secondary | ICD-10-CM

## 2020-02-13 LAB — POCT PREGNANCY, URINE: Preg Test, Ur: NEGATIVE

## 2020-02-13 SURGERY — NEPHRECTOMY, HAND-ASSISTED, LAPAROSCOPIC
Anesthesia: General

## 2020-02-13 MED ORDER — CEFAZOLIN SODIUM-DEXTROSE 2-4 GM/100ML-% IV SOLN
INTRAVENOUS | Status: AC
  Filled 2020-02-13: qty 100

## 2020-02-13 MED ORDER — FAMOTIDINE 20 MG PO TABS
ORAL_TABLET | ORAL | Status: AC
  Administered 2020-02-13: 20 mg via ORAL
  Filled 2020-02-13: qty 1

## 2020-02-13 MED ORDER — LACTATED RINGERS IV SOLN: INTRAVENOUS | Status: DC

## 2020-02-13 MED ORDER — CEFAZOLIN SODIUM-DEXTROSE 2-4 GM/100ML-% IV SOLN: 2.0000 g | INTRAVENOUS | Status: DC

## 2020-02-13 MED ORDER — FAMOTIDINE 20 MG PO TABS: 20.0000 mg | ORAL_TABLET | Freq: Once | ORAL | Status: AC

## 2020-02-13 MED ORDER — HEPARIN SODIUM (PORCINE) 5000 UNIT/ML IJ SOLN: 5000.0000 [IU] | INTRAMUSCULAR | Status: AC

## 2020-02-13 MED ORDER — HEPARIN SODIUM (PORCINE) 5000 UNIT/ML IJ SOLN
INTRAMUSCULAR | Status: AC
  Administered 2020-02-13: 5000 [IU] via SUBCUTANEOUS
  Filled 2020-02-13: qty 1

## 2020-02-13 SURGICAL SUPPLY — 90 items
ADH SKN CLS APL DERMABOND .7 (GAUZE/BANDAGES/DRESSINGS) ×1
AGENT HMST MTR 8 SURGIFLO (HEMOSTASIS)
ANCHOR TIS RET SYS 1550ML (BAG) IMPLANT
APL ESCP 34 STRL LF DISP (HEMOSTASIS)
APL PRP STRL LF DISP 70% ISPRP (MISCELLANEOUS) ×1
APPLICATOR SURGIFLO ENDO (HEMOSTASIS) IMPLANT
APPLIER CLIP ROT 10 11.4 M/L (STAPLE)
APPLIER CLIP ROT 13.4 12 LRG (CLIP)
APR CLP LRG 13.4X12 ROT 20 MLT (CLIP)
APR CLP MED LRG 11.4X10 (STAPLE)
BAG SPEC RTRVL C1550 25.4 (BAG)
CANISTER SUCT 1200ML W/VALVE (MISCELLANEOUS) ×3 IMPLANT
CHLORAPREP W/TINT 26 (MISCELLANEOUS) ×3 IMPLANT
CLEANER CAUTERY TIP 5X5 PAD (MISCELLANEOUS) ×2 IMPLANT
CLIP APPLIE ROT 10 11.4 M/L (STAPLE) IMPLANT
CLIP APPLIE ROT 13.4 12 LRG (CLIP) IMPLANT
CLIP VESOLOCK LG 6/CT PURPLE (CLIP) ×6 IMPLANT
COVER WAND RF STERILE (DRAPES) ×3 IMPLANT
CUTTER ECHEON FLEX ENDO 45 340 (ENDOMECHANICALS) IMPLANT
DEFOGGER SCOPE WARMER CLEARIFY (MISCELLANEOUS) ×3 IMPLANT
DERMABOND ADVANCED (GAUZE/BANDAGES/DRESSINGS) ×1
DERMABOND ADVANCED .7 DNX12 (GAUZE/BANDAGES/DRESSINGS) ×2 IMPLANT
DISSECTOR KITTNER STICK (MISCELLANEOUS) IMPLANT
DISSECTORS/KITTNER STICK (MISCELLANEOUS)
DRAPE INCISE IOBAN 66X45 STRL (DRAPES) ×3 IMPLANT
DRAPE SURG 17X11 SM STRL (DRAPES) ×12 IMPLANT
DRSG TELFA 3X8 NADH (GAUZE/BANDAGES/DRESSINGS) IMPLANT
ELECT REM PT RETURN 9FT ADLT (ELECTROSURGICAL) ×2
ELECTRODE REM PT RTRN 9FT ADLT (ELECTROSURGICAL) ×2 IMPLANT
GLOVE BIO SURGEON STRL SZ 6.5 (GLOVE) ×3 IMPLANT
GLOVE BIOGEL PI IND STRL 7.5 (GLOVE) ×4 IMPLANT
GLOVE BIOGEL PI INDICATOR 7.5 (GLOVE) ×2
GOWN STRL REUS W/ TWL LRG LVL3 (GOWN DISPOSABLE) ×4 IMPLANT
GOWN STRL REUS W/ TWL XL LVL3 (GOWN DISPOSABLE) ×2 IMPLANT
GOWN STRL REUS W/TWL LRG LVL3 (GOWN DISPOSABLE) ×4
GOWN STRL REUS W/TWL XL LVL3 (GOWN DISPOSABLE) ×2
GRASPER SUT TROCAR 14GX15 (MISCELLANEOUS) ×3 IMPLANT
HANDLE YANKAUER SUCT BULB TIP (MISCELLANEOUS) IMPLANT
HEMOSTAT SURGICEL 2X14 (HEMOSTASIS) IMPLANT
HOLDER FOLEY CATH W/STRAP (MISCELLANEOUS) ×3 IMPLANT
IRRIGATION STRYKERFLOW (MISCELLANEOUS) ×2 IMPLANT
IRRIGATOR STRYKERFLOW (MISCELLANEOUS) ×2
KIT PINK PAD W/HEAD ARE REST (MISCELLANEOUS) ×2
KIT PINK PAD W/HEAD ARM REST (MISCELLANEOUS) ×2 IMPLANT
KIT TURNOVER KIT A (KITS) ×3 IMPLANT
L-HOOK LAP DISP 36CM (ELECTROSURGICAL) ×2
LABEL OR SOLS (LABEL) ×3 IMPLANT
LHOOK LAP DISP 36CM (ELECTROSURGICAL) ×2 IMPLANT
LIGASURE LAP ATLAS 10MM 37CM (INSTRUMENTS) IMPLANT
LOOP RED MAXI  1X406MM (MISCELLANEOUS)
LOOP VESSEL MAXI 1X406 RED (MISCELLANEOUS) IMPLANT
NDL HYPO 21X1.5 SAFETY (NEEDLE) ×1 IMPLANT
NEEDLE HYPO 21X1.5 SAFETY (NEEDLE) ×2 IMPLANT
NEEDLE HYPO 22GX1.5 SAFETY (NEEDLE) ×3 IMPLANT
PACK LAP CHOLECYSTECTOMY (MISCELLANEOUS) ×3 IMPLANT
PAD CLEANER CAUTERY TIP 5X5 (MISCELLANEOUS) ×1
PAD DRESSING TELFA 3X8 NADH (GAUZE/BANDAGES/DRESSINGS) IMPLANT
PENCIL ELECTRO HAND CTR (MISCELLANEOUS) ×3 IMPLANT
RELOAD STAPLE 35X2.5 WHT THIN (STAPLE) IMPLANT
RELOAD STAPLE 60 2.6 WHT THN (STAPLE) IMPLANT
RELOAD STAPLER WHITE 60MM (STAPLE) IMPLANT
RELOAD WH ECHELON 45 (STAPLE) IMPLANT
SCISSORS METZENBAUM CVD 33 (INSTRUMENTS) ×3 IMPLANT
SET TUBE SMOKE EVAC HIGH FLOW (TUBING) ×3 IMPLANT
SPOGE SURGIFLO 8M (HEMOSTASIS)
SPONGE LAP 18X18 RF (DISPOSABLE) ×3 IMPLANT
SPONGE SURGIFLO 8M (HEMOSTASIS) IMPLANT
STAPLE RELOAD 2.5MM WHITE (STAPLE) IMPLANT
STAPLER ECHELON LONG 60 440 (INSTRUMENTS) IMPLANT
STAPLER RELOAD WHITE 60MM (STAPLE)
STAPLER SKIN PROX 35W (STAPLE) ×3 IMPLANT
STAPLER VASCULAR ECHELON 35 (CUTTER) IMPLANT
SURGILUBE 2OZ TUBE FLIPTOP (MISCELLANEOUS) ×3 IMPLANT
SUT MNCRL AB 4-0 PS2 18 (SUTURE) ×6 IMPLANT
SUT PDS AB 1 TP1 96 (SUTURE) ×3 IMPLANT
SUT VIC AB 0 CT1 36 (SUTURE) IMPLANT
SUT VIC AB 1 CT1 36 (SUTURE) ×6 IMPLANT
SUT VIC AB 2-0 SH 27 (SUTURE)
SUT VIC AB 2-0 SH 27XBRD (SUTURE) IMPLANT
SUT VIC AB 2-0 UR6 27 (SUTURE) IMPLANT
SUT VIC AB 4-0 FS2 27 (SUTURE) ×3 IMPLANT
SUT VICRYL 0 AB UR-6 (SUTURE) ×6 IMPLANT
SYR 20ML LL LF (SYRINGE) ×3 IMPLANT
SYS LAPSCP GELPORT 120MM (MISCELLANEOUS) ×2
SYSTEM LAPSCP GELPORT 120MM (MISCELLANEOUS) ×2 IMPLANT
TRAY FOLEY MTR SLVR 16FR STAT (SET/KITS/TRAYS/PACK) ×3 IMPLANT
TROCAR ENDOPATH XCEL 12X100 BL (ENDOMECHANICALS) ×6 IMPLANT
TROCAR XCEL 12X100 BLDLESS (ENDOMECHANICALS) ×3 IMPLANT
WATER STERILE IRR 1000ML POUR (IV SOLUTION) ×3 IMPLANT
WATER STERILE IRR 3000ML UROMA (IV SOLUTION) ×3 IMPLANT

## 2020-02-13 NOTE — Anesthesia Preprocedure Evaluation (Deleted)
Anesthesia Evaluation  Patient identified by MRN, date of birth, ID band Patient awake    Reviewed: Allergy & Precautions, NPO status , Patient's Chart, lab work & pertinent test results  History of Anesthesia Complications Negative for: history of anesthetic complications  Airway Mallampati: II  TM Distance: >3 FB Neck ROM: Full    Dental no notable dental hx. (+) Teeth Intact   Pulmonary neg pulmonary ROS, neg sleep apnea, neg COPD, Current Smoker and Patient abstained from smoking.,    Pulmonary exam normal breath sounds clear to auscultation       Cardiovascular Exercise Tolerance: Good METS(-) hypertension(-) CAD and (-) Past MI negative cardio ROS  (-) dysrhythmias  Rhythm:Regular Rate:Normal - Systolic murmurs    Neuro/Psych  Headaches, PSYCHIATRIC DISORDERS Depression Bipolar Disorder Schizophrenia    GI/Hepatic hiatal hernia, GERD  Controlled,(+)     (-) substance abuse  ,   Endo/Other  neg diabetes  Renal/GU Renal cancer     Musculoskeletal   Abdominal (+) + obese,   Peds  Hematology   Anesthesia Other Findings Past Medical History: No date: Anemia No date: Cancer (HCC) No date: GERD (gastroesophageal reflux disease) No date: History of hiatal hernia No date: History of trichomonal vaginitis     Comment:  07/2015 No date: Mental disorder     Comment:  bipolar No date: Nausea and vomiting during pregnancy  Reproductive/Obstetrics                             Anesthesia Physical Anesthesia Plan  ASA: III  Anesthesia Plan: General   Post-op Pain Management:    Induction: Intravenous  PONV Risk Score and Plan: 4 or greater and Ondansetron, Dexamethasone and Midazolam  Airway Management Planned: Oral ETT  Additional Equipment: Arterial line  Intra-op Plan:   Post-operative Plan: Extubation in OR  Informed Consent: I have reviewed the patients History and  Physical, chart, labs and discussed the procedure including the risks, benefits and alternatives for the proposed anesthesia with the patient or authorized representative who has indicated his/her understanding and acceptance.     Dental advisory given  Plan Discussed with: CRNA and Surgeon  Anesthesia Plan Comments: (Discussed risks of anesthesia with patient, including PONV, sore throat, lip/dental damage. Rare risks discussed as well, such as surgical bleeding requiring blood products, cardiorespiratory and neurological sequelae. Patient understands. Also discussed possibility of arterial line and patient understands.)        Anesthesia Quick Evaluation

## 2020-02-15 ENCOUNTER — Other Ambulatory Visit: Payer: Self-pay | Admitting: Radiology

## 2020-02-15 DIAGNOSIS — N2889 Other specified disorders of kidney and ureter: Secondary | ICD-10-CM

## 2020-02-15 MED ORDER — FENTANYL CITRATE (PF) 100 MCG/2ML IJ SOLN: 25.0000 ug | INTRAMUSCULAR | Status: DC | PRN

## 2020-02-15 MED ORDER — ONDANSETRON HCL 4 MG/2ML IJ SOLN: 4.0000 mg | Freq: Once | INTRAMUSCULAR | Status: DC | PRN

## 2020-02-15 MED ORDER — ACETAMINOPHEN 10 MG/ML IV SOLN: 1000.0000 mg | Freq: Once | INTRAVENOUS | Status: DC | PRN

## 2020-02-15 MED ORDER — OXYCODONE HCL 5 MG/5ML PO SOLN: 5.0000 mg | Freq: Once | ORAL | Status: DC | PRN

## 2020-02-15 MED ORDER — OXYCODONE HCL 5 MG PO TABS: 5.0000 mg | ORAL_TABLET | Freq: Once | ORAL | Status: DC | PRN

## 2020-02-16 ENCOUNTER — Encounter
Admission: RE | Disposition: A | Discharge status: Home / Self Care | Payer: Self-pay | Source: Ambulatory Visit | Attending: Urology

## 2020-02-16 ENCOUNTER — Inpatient Hospital Stay
Admission: RE | Admit: 2020-02-16 | Discharge: 2020-02-18 | DRG: 657 | Disposition: A | Discharge status: Home / Self Care | Payer: Medicaid Other | Source: Ambulatory Visit | Attending: Urology | Admitting: Urology

## 2020-02-16 ENCOUNTER — Inpatient Hospital Stay: Payer: Medicaid Other

## 2020-02-16 ENCOUNTER — Other Ambulatory Visit: Payer: Self-pay

## 2020-02-16 ENCOUNTER — Encounter: Payer: Self-pay | Admitting: Urology

## 2020-02-16 DIAGNOSIS — Z885 Allergy status to narcotic agent status: Secondary | ICD-10-CM | POA: Diagnosis not present

## 2020-02-16 DIAGNOSIS — D4102 Neoplasm of uncertain behavior of left kidney: Secondary | ICD-10-CM

## 2020-02-16 DIAGNOSIS — F209 Schizophrenia, unspecified: Secondary | ICD-10-CM | POA: Diagnosis present

## 2020-02-16 DIAGNOSIS — N2889 Other specified disorders of kidney and ureter: Secondary | ICD-10-CM

## 2020-02-16 DIAGNOSIS — Z20822 Contact with and (suspected) exposure to covid-19: Secondary | ICD-10-CM | POA: Diagnosis present

## 2020-02-16 DIAGNOSIS — K219 Gastro-esophageal reflux disease without esophagitis: Secondary | ICD-10-CM | POA: Diagnosis present

## 2020-02-16 DIAGNOSIS — J45909 Unspecified asthma, uncomplicated: Secondary | ICD-10-CM | POA: Diagnosis present

## 2020-02-16 DIAGNOSIS — Z6841 Body Mass Index (BMI) 40.0 and over, adult: Secondary | ICD-10-CM | POA: Diagnosis not present

## 2020-02-16 DIAGNOSIS — F17210 Nicotine dependence, cigarettes, uncomplicated: Secondary | ICD-10-CM | POA: Diagnosis present

## 2020-02-16 DIAGNOSIS — C642 Malignant neoplasm of left kidney, except renal pelvis: Principal | ICD-10-CM | POA: Diagnosis present

## 2020-02-16 HISTORY — PX: LAPAROSCOPIC NEPHRECTOMY, HAND ASSISTED: SHX1929

## 2020-02-16 LAB — BPAM RBC
Blood Product Expiration Date: 202105152359
Blood Product Expiration Date: 202105172359
Unit Type and Rh: 7300
Unit Type and Rh: 7300

## 2020-02-16 LAB — PREPARE RBC (CROSSMATCH)

## 2020-02-16 LAB — TYPE AND SCREEN
ABO/RH(D): B POS
Antibody Screen: NEGATIVE
Unit division: 0
Unit division: 0

## 2020-02-16 SURGERY — NEPHRECTOMY, HAND-ASSISTED, LAPAROSCOPIC
Anesthesia: General | Site: Abdomen | Laterality: Left

## 2020-02-16 MED ORDER — FENTANYL CITRATE (PF) 100 MCG/2ML IJ SOLN
INTRAMUSCULAR | Status: AC
  Administered 2020-02-16: 25 ug via INTRAVENOUS
  Filled 2020-02-16: qty 2

## 2020-02-16 MED ORDER — FENTANYL CITRATE (PF) 100 MCG/2ML IJ SOLN
INTRAMUSCULAR | Status: AC
  Filled 2020-02-16: qty 2

## 2020-02-16 MED ORDER — CEFAZOLIN SODIUM-DEXTROSE 2-4 GM/100ML-% IV SOLN
INTRAVENOUS | Status: AC
  Filled 2020-02-16: qty 100

## 2020-02-16 MED ORDER — LACTATED RINGERS IV SOLN: INTRAVENOUS | Status: DC

## 2020-02-16 MED ORDER — ONDANSETRON HCL 4 MG/2ML IJ SOLN
INTRAMUSCULAR | Status: DC | PRN
  Administered 2020-02-16: 4 mg via INTRAVENOUS

## 2020-02-16 MED ORDER — ONDANSETRON HCL 4 MG/2ML IJ SOLN: 4.0000 mg | INTRAMUSCULAR | Status: DC | PRN

## 2020-02-16 MED ORDER — ROCURONIUM BROMIDE 100 MG/10ML IV SOLN
INTRAVENOUS | Status: DC | PRN
  Administered 2020-02-16: 60 mg via INTRAVENOUS
  Administered 2020-02-16: 40 mg via INTRAVENOUS

## 2020-02-16 MED ORDER — SODIUM CHLORIDE 0.9% IV SOLUTION: Freq: Once | INTRAVENOUS | Status: DC

## 2020-02-16 MED ORDER — PROPOFOL 10 MG/ML IV BOLUS
INTRAVENOUS | Status: AC
  Filled 2020-02-16: qty 40

## 2020-02-16 MED ORDER — FAMOTIDINE 20 MG PO TABS
ORAL_TABLET | ORAL | Status: AC
  Administered 2020-02-16: 20 mg via ORAL
  Filled 2020-02-16: qty 1

## 2020-02-16 MED ORDER — ACETAMINOPHEN 325 MG PO TABS: 650.0000 mg | ORAL_TABLET | ORAL | Status: DC | PRN

## 2020-02-16 MED ORDER — SUGAMMADEX SODIUM 200 MG/2ML IV SOLN
INTRAVENOUS | Status: DC | PRN
  Administered 2020-02-16: 50 mg via INTRAVENOUS
  Administered 2020-02-16: 100 mg via INTRAVENOUS
  Administered 2020-02-16 (×2): 50 mg via INTRAVENOUS

## 2020-02-16 MED ORDER — ROCURONIUM BROMIDE 10 MG/ML (PF) SYRINGE
PREFILLED_SYRINGE | INTRAVENOUS | Status: AC
  Filled 2020-02-16: qty 10

## 2020-02-16 MED ORDER — MEPERIDINE HCL 50 MG/ML IJ SOLN
INTRAMUSCULAR | Status: AC
  Filled 2020-02-16: qty 1

## 2020-02-16 MED ORDER — KETAMINE HCL 50 MG/ML IJ SOLN
INTRAMUSCULAR | Status: AC
  Filled 2020-02-16: qty 10

## 2020-02-16 MED ORDER — ACETAMINOPHEN 160 MG/5ML PO SOLN
325.0000 mg | ORAL | Status: DC | PRN
  Filled 2020-02-16: qty 20.3

## 2020-02-16 MED ORDER — KETAMINE HCL 10 MG/ML IJ SOLN
INTRAMUSCULAR | Status: DC | PRN
  Administered 2020-02-16 (×2): 25 mg via INTRAVENOUS

## 2020-02-16 MED ORDER — MEPERIDINE HCL 50 MG/ML IJ SOLN
6.2500 mg | INTRAMUSCULAR | Status: DC | PRN
  Administered 2020-02-16: 12.5 mg via INTRAVENOUS

## 2020-02-16 MED ORDER — FENTANYL CITRATE (PF) 100 MCG/2ML IJ SOLN
25.0000 ug | INTRAMUSCULAR | Status: DC | PRN
  Administered 2020-02-16 (×4): 50 ug via INTRAVENOUS
  Filled 2020-02-16 (×4): qty 2

## 2020-02-16 MED ORDER — DEXMEDETOMIDINE HCL IN NACL 80 MCG/20ML IV SOLN
INTRAVENOUS | Status: AC
  Filled 2020-02-16: qty 20

## 2020-02-16 MED ORDER — HEPARIN SODIUM (PORCINE) 5000 UNIT/ML IJ SOLN
INTRAMUSCULAR | Status: AC
  Administered 2020-02-16: 5000 [IU] via SUBCUTANEOUS
  Filled 2020-02-16: qty 1

## 2020-02-16 MED ORDER — DEXMEDETOMIDINE HCL 200 MCG/2ML IV SOLN
INTRAVENOUS | Status: DC | PRN
  Administered 2020-02-16: 8 ug via INTRAVENOUS

## 2020-02-16 MED ORDER — PHENYLEPHRINE HCL (PRESSORS) 10 MG/ML IV SOLN
INTRAVENOUS | Status: AC
  Filled 2020-02-16: qty 1

## 2020-02-16 MED ORDER — CHLORHEXIDINE GLUCONATE CLOTH 2 % EX PADS: 6.0000 | MEDICATED_PAD | Freq: Every day | CUTANEOUS | Status: DC

## 2020-02-16 MED ORDER — GLYCOPYRROLATE 0.2 MG/ML IJ SOLN
INTRAMUSCULAR | Status: DC | PRN
  Administered 2020-02-16: .2 mg via INTRAVENOUS

## 2020-02-16 MED ORDER — MIDAZOLAM HCL 2 MG/2ML IJ SOLN
INTRAMUSCULAR | Status: AC
  Filled 2020-02-16: qty 2

## 2020-02-16 MED ORDER — BUPIVACAINE LIPOSOME 1.3 % IJ SUSP
INTRAMUSCULAR | Status: DC | PRN
  Administered 2020-02-16: 50 mL

## 2020-02-16 MED ORDER — PHENYLEPHRINE HCL (PRESSORS) 10 MG/ML IV SOLN
INTRAVENOUS | Status: DC | PRN
  Administered 2020-02-16 (×2): 100 ug via INTRAVENOUS
  Administered 2020-02-16: 150 ug via INTRAVENOUS
  Administered 2020-02-16: 100 ug via INTRAVENOUS
  Administered 2020-02-16 (×3): 150 ug via INTRAVENOUS
  Administered 2020-02-16 (×2): 100 ug via INTRAVENOUS

## 2020-02-16 MED ORDER — LACTATED RINGERS IV SOLN: INTRAVENOUS | Status: DC | PRN

## 2020-02-16 MED ORDER — HEPARIN SODIUM (PORCINE) 5000 UNIT/ML IJ SOLN: 5000.0000 [IU] | INTRAMUSCULAR | Status: AC

## 2020-02-16 MED ORDER — LIDOCAINE HCL (CARDIAC) PF 100 MG/5ML IV SOSY
PREFILLED_SYRINGE | INTRAVENOUS | Status: DC | PRN
  Administered 2020-02-16: 100 mg via INTRAVENOUS

## 2020-02-16 MED ORDER — FAMOTIDINE 20 MG PO TABS: 20.0000 mg | ORAL_TABLET | Freq: Once | ORAL | Status: AC

## 2020-02-16 MED ORDER — LIDOCAINE HCL (PF) 2 % IJ SOLN
INTRAMUSCULAR | Status: AC
  Filled 2020-02-16: qty 5

## 2020-02-16 MED ORDER — CEFAZOLIN SODIUM-DEXTROSE 2-4 GM/100ML-% IV SOLN
2.0000 g | INTRAVENOUS | Status: AC
  Administered 2020-02-16: 2 g via INTRAVENOUS

## 2020-02-16 MED ORDER — GLYCOPYRROLATE 0.2 MG/ML IJ SOLN
INTRAMUSCULAR | Status: AC
  Filled 2020-02-16: qty 1

## 2020-02-16 MED ORDER — FENTANYL CITRATE (PF) 100 MCG/2ML IJ SOLN
INTRAMUSCULAR | Status: DC | PRN
  Administered 2020-02-16 (×2): 50 ug via INTRAVENOUS
  Administered 2020-02-16: 100 ug via INTRAVENOUS

## 2020-02-16 MED ORDER — TRAMADOL HCL 50 MG PO TABS
50.0000 mg | ORAL_TABLET | Freq: Four times a day (QID) | ORAL | Status: DC | PRN
  Administered 2020-02-16 – 2020-02-17 (×3): 50 mg via ORAL
  Filled 2020-02-16 (×4): qty 1

## 2020-02-16 MED ORDER — SODIUM CHLORIDE 0.45 % IV SOLN: INTRAVENOUS | Status: DC

## 2020-02-16 MED ORDER — PROPOFOL 10 MG/ML IV BOLUS
INTRAVENOUS | Status: DC | PRN
  Administered 2020-02-16: 180 mg via INTRAVENOUS

## 2020-02-16 MED ORDER — PROMETHAZINE HCL 25 MG/ML IJ SOLN: 6.2500 mg | INTRAMUSCULAR | Status: DC | PRN

## 2020-02-16 MED ORDER — SERTRALINE HCL 50 MG PO TABS
150.0000 mg | ORAL_TABLET | ORAL | Status: DC
  Administered 2020-02-17 – 2020-02-18 (×2): 150 mg via ORAL
  Filled 2020-02-16 (×2): qty 3

## 2020-02-16 MED ORDER — MIDAZOLAM HCL 2 MG/2ML IJ SOLN
INTRAMUSCULAR | Status: DC | PRN
  Administered 2020-02-16: 2 mg via INTRAVENOUS

## 2020-02-16 MED ORDER — HEPARIN SODIUM (PORCINE) 5000 UNIT/ML IJ SOLN
5000.0000 [IU] | Freq: Three times a day (TID) | INTRAMUSCULAR | Status: DC
  Administered 2020-02-17 – 2020-02-18 (×5): 5000 [IU] via SUBCUTANEOUS
  Filled 2020-02-16 (×5): qty 1

## 2020-02-16 MED ORDER — DOCUSATE SODIUM 100 MG PO CAPS
100.0000 mg | ORAL_CAPSULE | Freq: Two times a day (BID) | ORAL | Status: DC
  Administered 2020-02-16 – 2020-02-18 (×5): 100 mg via ORAL
  Filled 2020-02-16 (×5): qty 1

## 2020-02-16 MED ORDER — ACETAMINOPHEN 325 MG PO TABS: 325.0000 mg | ORAL_TABLET | ORAL | Status: DC | PRN

## 2020-02-16 MED ORDER — RISPERIDONE 0.5 MG PO TABS
0.5000 mg | ORAL_TABLET | Freq: Every day | ORAL | Status: DC
  Administered 2020-02-16 – 2020-02-17 (×2): 0.5 mg via ORAL
  Filled 2020-02-16 (×3): qty 1

## 2020-02-16 MED ORDER — SODIUM CHLORIDE 0.9 % IV SOLN
INTRAVENOUS | Status: DC | PRN
  Administered 2020-02-16: 50 ug/min via INTRAVENOUS

## 2020-02-16 MED ORDER — FENTANYL CITRATE (PF) 100 MCG/2ML IJ SOLN
25.0000 ug | INTRAMUSCULAR | Status: AC | PRN
  Administered 2020-02-16 (×4): 25 ug via INTRAVENOUS

## 2020-02-16 MED ORDER — GABAPENTIN 300 MG PO CAPS: 300.0000 mg | ORAL_CAPSULE | Freq: Three times a day (TID) | ORAL | Status: DC | PRN

## 2020-02-16 MED ORDER — DEXAMETHASONE SODIUM PHOSPHATE 10 MG/ML IJ SOLN
INTRAMUSCULAR | Status: DC | PRN
  Administered 2020-02-16: 5 mg via INTRAVENOUS

## 2020-02-16 SURGICAL SUPPLY — 93 items
ADH SKN CLS APL DERMABOND .7 (GAUZE/BANDAGES/DRESSINGS) ×1
AGENT HMST MTR 8 SURGIFLO (HEMOSTASIS)
ANCHOR TIS RET SYS 1550ML (BAG) IMPLANT
APL ESCP 34 STRL LF DISP (HEMOSTASIS)
APL PRP STRL LF DISP 70% ISPRP (MISCELLANEOUS) ×1
APPLICATOR SURGIFLO ENDO (HEMOSTASIS) IMPLANT
APPLIER CLIP ROT 10 11.4 M/L (STAPLE)
APPLIER CLIP ROT 13.4 12 LRG (CLIP)
APR CLP LRG 13.4X12 ROT 20 MLT (CLIP)
APR CLP MED LRG 11.4X10 (STAPLE)
BAG SPEC RTRVL C1550 25.4 (BAG)
CANISTER SUCT 1200ML W/VALVE (MISCELLANEOUS) ×2 IMPLANT
CHLORAPREP W/TINT 26 (MISCELLANEOUS) ×2 IMPLANT
CLEANER CAUTERY TIP 5X5 PAD (MISCELLANEOUS) ×1 IMPLANT
CLIP APPLIE ROT 10 11.4 M/L (STAPLE) IMPLANT
CLIP APPLIE ROT 13.4 12 LRG (CLIP) IMPLANT
CLIP VESOLOCK LG 6/CT PURPLE (CLIP) ×4 IMPLANT
COVER WAND RF STERILE (DRAPES) ×2 IMPLANT
CUTTER ECHEON FLEX ENDO 45 340 (ENDOMECHANICALS) IMPLANT
DEFOGGER SCOPE WARMER CLEARIFY (MISCELLANEOUS) ×2 IMPLANT
DERMABOND ADVANCED (GAUZE/BANDAGES/DRESSINGS) ×1
DERMABOND ADVANCED .7 DNX12 (GAUZE/BANDAGES/DRESSINGS) ×1 IMPLANT
DISSECTOR KITTNER STICK (MISCELLANEOUS) IMPLANT
DISSECTORS/KITTNER STICK (MISCELLANEOUS)
DRAPE INCISE IOBAN 66X45 STRL (DRAPES) ×2 IMPLANT
DRAPE SURG 17X11 SM STRL (DRAPES) ×8 IMPLANT
DRSG TELFA 3X8 NADH (GAUZE/BANDAGES/DRESSINGS) IMPLANT
ELECT REM PT RETURN 9FT ADLT (ELECTROSURGICAL) ×2
ELECTRODE REM PT RTRN 9FT ADLT (ELECTROSURGICAL) ×1 IMPLANT
GLOVE BIO SURGEON STRL SZ 6.5 (GLOVE) ×2 IMPLANT
GLOVE BIOGEL PI IND STRL 7.5 (GLOVE) ×2 IMPLANT
GLOVE BIOGEL PI INDICATOR 7.5 (GLOVE) ×2
GOWN STRL REUS W/ TWL LRG LVL3 (GOWN DISPOSABLE) ×2 IMPLANT
GOWN STRL REUS W/ TWL XL LVL3 (GOWN DISPOSABLE) ×1 IMPLANT
GOWN STRL REUS W/TWL LRG LVL3 (GOWN DISPOSABLE) ×4
GOWN STRL REUS W/TWL XL LVL3 (GOWN DISPOSABLE) ×2
GRASPER SUT TROCAR 14GX15 (MISCELLANEOUS) ×2 IMPLANT
HANDLE YANKAUER SUCT BULB TIP (MISCELLANEOUS) IMPLANT
HEMOSTAT SURGICEL 2X14 (HEMOSTASIS) IMPLANT
HEMOSTAT SURGICEL 4X8 (HEMOSTASIS) ×1 IMPLANT
HOLDER FOLEY CATH W/STRAP (MISCELLANEOUS) ×2 IMPLANT
IRRIGATION STRYKERFLOW (MISCELLANEOUS) ×1 IMPLANT
IRRIGATOR STRYKERFLOW (MISCELLANEOUS) ×2
KIT PINK PAD W/HEAD ARE REST (MISCELLANEOUS) ×2
KIT PINK PAD W/HEAD ARM REST (MISCELLANEOUS) ×1 IMPLANT
KIT TURNOVER KIT A (KITS) ×2 IMPLANT
L-HOOK LAP DISP 36CM (ELECTROSURGICAL) ×2
LABEL OR SOLS (LABEL) ×2 IMPLANT
LHOOK LAP DISP 36CM (ELECTROSURGICAL) ×1 IMPLANT
LIGASURE LAP ATLAS 10MM 37CM (INSTRUMENTS) ×1 IMPLANT
LOOP RED MAXI  1X406MM (MISCELLANEOUS) ×1
LOOP VESSEL MAXI  1X406 RED (MISCELLANEOUS) ×1
LOOP VESSEL MAXI 1X406 RED (MISCELLANEOUS) IMPLANT
NDL HYPO 21X1.5 SAFETY (NEEDLE) ×1 IMPLANT
NEEDLE HYPO 21X1.5 SAFETY (NEEDLE) ×2 IMPLANT
NEEDLE HYPO 22GX1.5 SAFETY (NEEDLE) ×2 IMPLANT
PACK LAP CHOLECYSTECTOMY (MISCELLANEOUS) ×2 IMPLANT
PAD CLEANER CAUTERY TIP 5X5 (MISCELLANEOUS) ×1
PAD DRESSING TELFA 3X8 NADH (GAUZE/BANDAGES/DRESSINGS) IMPLANT
PENCIL ELECTRO HAND CTR (MISCELLANEOUS) ×2 IMPLANT
RELOAD STAPLE 35X2.5 WHT THIN (STAPLE) IMPLANT
RELOAD STAPLE 60 2.6 WHT THN (STAPLE) IMPLANT
RELOAD STAPLER WHITE 60MM (STAPLE) ×1 IMPLANT
RELOAD WH ECHELON 45 (STAPLE) IMPLANT
SCISSORS METZENBAUM CVD 33 (INSTRUMENTS) ×1 IMPLANT
SET TUBE SMOKE EVAC HIGH FLOW (TUBING) ×2 IMPLANT
SOLUTION ELECTROLUBE (MISCELLANEOUS) ×1 IMPLANT
SPOGE SURGIFLO 8M (HEMOSTASIS)
SPONGE LAP 18X18 RF (DISPOSABLE) ×2 IMPLANT
SPONGE SURGIFLO 8M (HEMOSTASIS) IMPLANT
STAPLE RELOAD 2.5MM WHITE (STAPLE) IMPLANT
STAPLER ECHELON LONG 60 440 (INSTRUMENTS) ×1 IMPLANT
STAPLER RELOAD WHITE 60MM (STAPLE) ×2
STAPLER SKIN PROX 35W (STAPLE) ×2 IMPLANT
STAPLER VASCULAR ECHELON 35 (CUTTER) IMPLANT
SURGILUBE 2OZ TUBE FLIPTOP (MISCELLANEOUS) ×2 IMPLANT
SUT MNCRL AB 4-0 PS2 18 (SUTURE) ×4 IMPLANT
SUT PDS AB 1 TP1 96 (SUTURE) ×2 IMPLANT
SUT VIC AB 0 CT1 36 (SUTURE) IMPLANT
SUT VIC AB 1 CT1 36 (SUTURE) ×2 IMPLANT
SUT VIC AB 2-0 SH 27 (SUTURE) ×2
SUT VIC AB 2-0 SH 27XBRD (SUTURE) IMPLANT
SUT VIC AB 2-0 UR6 27 (SUTURE) IMPLANT
SUT VIC AB 4-0 FS2 27 (SUTURE) ×1 IMPLANT
SUT VICRYL 0 AB UR-6 (SUTURE) ×4 IMPLANT
SYR 20ML LL LF (SYRINGE) ×2 IMPLANT
SYS LAPSCP GELPORT 120MM (MISCELLANEOUS) ×2
SYSTEM LAPSCP GELPORT 120MM (MISCELLANEOUS) ×1 IMPLANT
TRAY FOLEY MTR SLVR 16FR STAT (SET/KITS/TRAYS/PACK) ×2 IMPLANT
TROCAR ENDOPATH XCEL 12X100 BL (ENDOMECHANICALS) ×4 IMPLANT
TROCAR XCEL 12X100 BLDLESS (ENDOMECHANICALS) ×2 IMPLANT
WATER STERILE IRR 1000ML POUR (IV SOLUTION) ×2 IMPLANT
WATER STERILE IRR 3000ML UROMA (IV SOLUTION) ×2 IMPLANT

## 2020-02-16 NOTE — Op Note (Signed)
Date of procedure: 02/16/20  Preoperative diagnosis:  1. Left renal mass, 4 cm  Postoperative diagnosis:  1. Same  Procedure: 1. Laparoscopic hand-assisted left radical nephrectomy  Surgeon: Nickolas Madrid, MD  Assistant: Hollice Espy, MD  Anesthesia: General  Complications: None  Intraoperative findings:  1.  Uncomplicated left radical nephrectomy  EBL: 10 cc  Specimens: Left kidney and proximal ureter  Drains: 16 French Foley  Indication: Zoe Ramos is a 44 y.o. patient found to have a 4 cm enhancing left renal mass consistent with renal cell carcinoma.  After reviewing the management options for treatment, they elected to proceed with the above surgical procedure(s). We have discussed the potential benefits and risks of the procedure, side effects of the proposed treatment, the likelihood of the patient achieving the goals of the procedure, and any potential problems that might occur during the procedure or recuperation. Informed consent has been obtained.  Description of procedure:  The patient was taken to the operating room and general anesthesia was induced.  5000 units of subcutaneous heparin were given, and SCDs were placed for DVT prophylaxis.  The patient was placed in the modified flank position with the left side up, prepped, padded, and draped in the usual sterile fashion, and preoperative antibiotics(Ancef) were administered. A preoperative time-out was performed.   I started by making a 6 cm incision between the umbilicus and the sternum.  There was a fair amount of subcutaneous fat, and I dissected down until the fascia was encountered.  This was opened sharply at the midline.  The peritoneum was opened sharply as well, and there were no adhesions easily felt alongside our incision.  Her previously placed umbilical mesh could be felt inferiorly.  The gel hand port was placed, and the abdomen was insufflated to pressure 15.  Thorough inspection revealed no  significant adhesions in the left upper quadrant. Two additional 11 mm ports were placed under direct vision in the left lower quadrant, and these were pre-closed using the Carter-Thomason device and a Vicryl suture.  The laparoscopic LigaSure device was used to reflect the descending colon medially and the plane was identified between the left kidney and the bowel.  This was taken down from the spleen down inferiorly to the tail of Gerota's.  The gonadal vein and the ureter were easily identified and the gonadal was spared medially and the ureter suspended up and followed toward the hilum.  Once the renal vein was identified, additional dissection was performed superiorly between the upper pole and the adrenal down to the hilum.  The hilum was encircled easily, and a stapler with 60 mm vascular load was used to take the hilum uneventfully.  The residual lateral and superior attachments of the kidney were taken out using the LigaSure device.  The adrenal was spared.  There was excellent hemostasis.  The ureter was taken with 2 Weck clips on the stay side, and one on the kidney side, and the ureter was divided.  A Endo Catch bag was advanced through the most lateral lower port and the specimen was placed into the bag and removed through the GelPort.  Thorough inspection with a pressure of 5 showed no bleeding.  Surgicel was placed alongside the adrenal and the hilum.   The fascia of the midline incision was closed using a running looped PDS.  The previously placed sutures in the additional port sites were tied down.  All incisions were copiously irrigated.  A 2-0 Vicryl was used to close the  subcutaneous tissue in the midline incision.  The skin of all incisions was closed with 4-0 Vicryl subcuticular, and Dermabond was applied.  All counts were correct   Disposition: Stable to PACU  Plan: Admit overnight Anticipate discharge in 1 to 2 days Follow pathology  Nickolas Madrid, MD

## 2020-02-16 NOTE — Transfer of Care (Signed)
Immediate Anesthesia Transfer of Care Note  Patient: Zoe Ramos  Procedure(s) Performed: HAND ASSISTED LAPAROSCOPIC NEPHRECTOMY (Left Abdomen)  Patient Location: PACU  Anesthesia Type:General  Level of Consciousness: drowsy  Airway & Oxygen Therapy: Patient Spontanous Breathing and Patient connected to face mask oxygen  Post-op Assessment: Report given to RN and Post -op Vital signs reviewed and stable  Post vital signs: Reviewed and stable  Last Vitals:  Vitals Value Taken Time  BP    Temp    Pulse 78 02/16/20 1034  Resp 15 02/16/20 1034  SpO2 100 % 02/16/20 1034  Vitals shown include unvalidated device data.  Last Pain:  Vitals:   02/16/20 0607  TempSrc: Tympanic  PainSc: 0-No pain         Complications: No apparent anesthesia complications

## 2020-02-16 NOTE — Anesthesia Procedure Notes (Signed)
Procedure Name: Intubation Date/Time: 02/16/2020 7:37 AM Performed by: Jonna Clark, CRNA Pre-anesthesia Checklist: Patient identified, Emergency Drugs available, Suction available and Patient being monitored Patient Re-evaluated:Patient Re-evaluated prior to induction Oxygen Delivery Method: Circle system utilized Preoxygenation: Pre-oxygenation with 100% oxygen Induction Type: IV induction Ventilation: Mask ventilation without difficulty Laryngoscope Size: McGraph and 3 (attempted with MAC 3 ) Grade View: Grade I Tube type: Oral Tube size: 7.0 mm Number of attempts: 1 Airway Equipment and Method: Stylet Placement Confirmation: ETT inserted through vocal cords under direct vision,  positive ETCO2 and breath sounds checked- equal and bilateral Secured at: 21 cm Tube secured with: Tape Dental Injury: Teeth and Oropharynx as per pre-operative assessment

## 2020-02-16 NOTE — H&P (Signed)
UROLOGY H&P UPDATE  Agree with prior H&P dated 01/19/20.  44 year old female with extensive psych history and extensive prior abdominal surgeries including umbilical hernia repair with mesh who presents with a 4 cm enhancing left renal mass worrisome for RCC.  Presents today for left radical laparoscopic hand-assisted nephrectomy.  Cardiac: RRR Lungs: CTA bilaterally  Laterality: Left Procedure: Left laparoscopic hand-assisted radical nephrectomy  Urine: Culture 4/1 with E. coli, treated with culture appropriate antibiotics  Informed consent obtained, we specifically discussed the risks of bleeding, infection, post-operative pain, need for additional procedures, damage to surrounding structures including bowel or vasculature, AKI/CKD, ileus, prolonged hospitalization, PE/MI/death.  Billey Co, MD Mar 14, 2020

## 2020-02-16 NOTE — Anesthesia Preprocedure Evaluation (Addendum)
Anesthesia Evaluation  Patient identified by MRN, date of birth, ID band Patient awake    Reviewed: Allergy & Precautions, H&P , NPO status , reviewed documented beta blocker date and time   Airway Mallampati: II  TM Distance: >3 FB Neck ROM: full    Dental  (+) Teeth Intact   Pulmonary asthma , Current Smoker and Patient abstained from smoking.,    Pulmonary exam normal        Cardiovascular Normal cardiovascular exam     Neuro/Psych  Headaches, PSYCHIATRIC DISORDERS Depression Bipolar Disorder Schizophrenia    GI/Hepatic hiatal hernia, GERD  Controlled,  Endo/Other  Morbid obesity  Renal/GU      Musculoskeletal   Abdominal   Peds  Hematology  (+) Blood dyscrasia, anemia ,   Anesthesia Other Findings Past Medical History: No date: Anemia No date: Cancer ([place]) No date: GERD (gastroesophageal reflux disease) No date: History of hiatal hernia No date: History of trichomonal vaginitis     Comment:  07/2015 No date: Mental disorder     Comment:  bipolar No date: Nausea and vomiting during pregnancy Past Surgical History: No date: GALLBLADDER SURGERY No date: HERNIA REPAIR     Comment:  hiatal hernia and umbilical [id number]: TUBAL LIGATION; Bilateral     Comment:  Procedure: POST PARTUM TUBAL LIGATION;  Surgeon: Rubie Maid, MD;  Location: [facility] ORS;  Service: Gynecology;                Laterality: Bilateral; No date: uterine ablasion BMI    Body Mass Index: 41.50 kg/m     Reproductive/Obstetrics                            Anesthesia Physical Anesthesia Plan  ASA: III  Anesthesia Plan: General   Post-op Pain Management:    Induction: Intravenous  PONV Risk Score and Plan: Ondansetron, Treatment may vary due to age or medical condition, Midazolam and Dexamethasone  Airway Management Planned: Oral ETT  Additional Equipment:   Intra-op Plan:   Post-operative  Plan: Extubation in OR  Informed Consent: I have reviewed the patients History and Physical, chart, labs and discussed the procedure including the risks, benefits and alternatives for the proposed anesthesia with the patient or authorized representative who has indicated his/her understanding and acceptance.     Dental Advisory Given  Plan Discussed with: CRNA  Anesthesia Plan Comments:        Anesthesia Quick Evaluation

## 2020-02-17 LAB — BPAM RBC
Blood Product Expiration Date: 202105112359
Blood Product Expiration Date: 202105182359
ISSUE DATE / TIME: 202104121433
Unit Type and Rh: 5100
Unit Type and Rh: 5100

## 2020-02-17 LAB — TYPE AND SCREEN
ABO/RH(D): B POS
Antibody Screen: NEGATIVE
Unit division: 0
Unit division: 0

## 2020-02-17 LAB — CBC
HCT: 32.3 % — ABNORMAL LOW (ref 36.0–46.0)
Hemoglobin: 9.8 g/dL — ABNORMAL LOW (ref 12.0–15.0)
MCH: 19.6 pg — ABNORMAL LOW (ref 26.0–34.0)
MCHC: 30.3 g/dL (ref 30.0–36.0)
MCV: 64.5 fL — ABNORMAL LOW (ref 80.0–100.0)
Platelets: 333 10*3/uL (ref 150–400)
RBC: 5.01 MIL/uL (ref 3.87–5.11)
RDW: 17 % — ABNORMAL HIGH (ref 11.5–15.5)
WBC: 13.3 10*3/uL — ABNORMAL HIGH (ref 4.0–10.5)
nRBC: 0 % (ref 0.0–0.2)

## 2020-02-17 LAB — BASIC METABOLIC PANEL
Anion gap: 6 (ref 5–15)
BUN: 9 mg/dL (ref 6–20)
CO2: 26 mmol/L (ref 22–32)
Calcium: 8.2 mg/dL — ABNORMAL LOW (ref 8.9–10.3)
Chloride: 105 mmol/L (ref 98–111)
Creatinine, Ser: 1.03 mg/dL — ABNORMAL HIGH (ref 0.44–1.00)
GFR calc Af Amer: >60 mL/min (ref >60)
GFR calc non Af Amer: >60 mL/min (ref >60)
Glucose, Bld: 106 mg/dL — ABNORMAL HIGH (ref 70–99)
Potassium: 3.9 mmol/L (ref 3.5–5.1)
Sodium: 137 mmol/L (ref 135–145)

## 2020-02-17 MED ORDER — DOCUSATE SODIUM 100 MG PO CAPS: 100.0000 mg | ORAL_CAPSULE | Freq: Two times a day (BID) | ORAL | 0 refills | Status: DC

## 2020-02-17 MED ORDER — OXYCODONE-ACETAMINOPHEN 5-325 MG PO TABS
1.0000 | ORAL_TABLET | Freq: Four times a day (QID) | ORAL | Status: DC | PRN
  Administered 2020-02-17 – 2020-02-18 (×3): 2 via ORAL
  Administered 2020-02-18: 1 via ORAL
  Filled 2020-02-17 (×3): qty 2
  Filled 2020-02-17: qty 1

## 2020-02-17 MED ORDER — OXYCODONE-ACETAMINOPHEN 5-325 MG PO TABS: 1.0000 | ORAL_TABLET | Freq: Four times a day (QID) | ORAL | 0 refills | Status: AC | PRN

## 2020-02-17 NOTE — Progress Notes (Signed)
   Subjective Tolerating clears without nausea or vomiting, denies any flatus or BM Pain not well controlled with tramadol alone, states she can take Percocet safely despite high morphine allergy in chart  Physical Exam: BP 121/60 (BP Location: Right Arm)   Pulse 65   Temp 98.8 F (37.1 C) (Oral)   Resp 20   Ht 5\' 7"  (1.702 m)   Wt 120.2 kg   LMP 01/30/2020 (Approximate)   SpO2 95%   BMI 41.50 kg/m    Constitutional:  Alert and oriented, No acute distress. Respiratory: Normal respiratory effort, no increased work of breathing. GI: Abdomen is soft, appropriately tender, nondistended, midline incision and left lower quadrant lap sites clean dry and intact with Dermabond Drains: Foley with clear yellow urine  Laboratory Data: Reviewed in epic  Excellent urine output overnight  Assessment & Plan:   Ms. Penry is a 44 year old female with morbid obesity and bipolar disorder, POD#1 status post left laparoscopic hand-assisted radical nephrectomy for 4 cm endophytic renal mass consistent with RCC. Progressing as expected.  Advance diet as tolerated today Trial of percocet for PO pain control DC foley and mIVF Out of bed, incentive spirometer, consider PT consult Dispo: Anticipate discharge home tomorrow if pain controlled and tolerating general diet   Billey Co, MD

## 2020-02-17 NOTE — Progress Notes (Signed)
Foley removed.

## 2020-02-17 NOTE — Anesthesia Postprocedure Evaluation (Signed)
Anesthesia Post Note  Patient: Vedia Pereyra  Procedure(s) Performed: HAND ASSISTED LAPAROSCOPIC NEPHRECTOMY (Left Abdomen)  Patient location during evaluation: PACU Anesthesia Type: General Level of consciousness: awake and alert Pain management: pain level controlled Vital Signs Assessment: post-procedure vital signs reviewed and stable Respiratory status: spontaneous breathing, nonlabored ventilation, respiratory function stable and patient connected to nasal cannula oxygen Cardiovascular status: blood pressure returned to baseline and stable Postop Assessment: no apparent nausea or vomiting Anesthetic complications: no     Last Vitals:  Vitals:   02/16/20 1952 02/17/20 0518  BP: 107/68 116/73  Pulse: 67 63  Resp:  18  Temp: (!) 36.3 C 36.8 C  SpO2: 95% 97%    Last Pain:  Vitals:   02/17/20 0518  TempSrc: Oral  PainSc:                  Alphonsus Sias

## 2020-02-17 NOTE — Progress Notes (Signed)
Patient seen again this afternoon.  She has ambulated and is tolerating p.o. intake well.  She denies nausea or vomiting.  No flatus yet.  She reports stable pain.

## 2020-02-18 NOTE — Discharge Summary (Signed)
Physician Discharge Summary  Patient ID: Zoe Ramos MRN: [id number] DOB/AGE: 1976-09-27 44 y.o.  Admit date: 02/16/2020 Discharge date: 02/18/2020  Admission Diagnoses: Left renal mass  Discharge Diagnoses:  Active Problems:   Left renal mass   Discharged Condition: good  Hospital Course: Zoe Ramos was admitted following laparoscopic hand-assisted left radical nephrectomy on February 16, 2020.  She has done well.  Her diet was advanced to regular and she has tolerated a regular diet.  She has been ambulating.  She reports not passing flatus, but no nausea, hiccups or burping.  She is voiding well.  Vital stable.  Ready for discharge.  Consults: None  Significant Diagnostic Studies: none  Treatments: surgery: Left laparoscopic hand-assisted radical nephrectomy  Discharge Exam: Blood pressure 134/84, pulse 80, temperature 98 F (36.7 C), temperature source Oral, resp. rate 18, height '5\' 7"'$  (1.702 m), weight 120.2 kg, last menstrual period 01/30/2020, SpO2 95 %. She watching TV Alert and oriented Cardiovascular-regular rate and rhythm Lungs-regular effort and depth Abdomen-soft, nontender.  Incisions are clean dry and intact sealed with Dermabond Extremities-no calf pain or swelling  Disposition: Discharge disposition: 01-Home or Self Care       Discharge Instructions    Discharge instructions   Complete by: As directed    You underwent a left laparoscopic removal of your kidney for a 4 cm kidney tumor.  For pain control, take Tylenol as needed.  Avoid anti-inflammatories like Aleve and ibuprofen for at least 1 week.  Take Percocet as needed for moderate to severe pain.  Minimize the Percocet, as it can cause significant constipation.  Drink plenty of fluids to keep your other kidney flushed.  Water is best.  No heavy lifting more than 10 pounds for 4 to 6 weeks.  Walk daily to prevent blood clots in the legs.  Avoid heavy and fried foods for the first few weeks after  surgery.  Okay to shower starting Saturday 4/17.  Wash incisions gently with soap and water and pat dry. No tub baths or swimming for 3 weeks.  Call the clinic or present to the ER if you have fever over 101.3, uncontrolled abdominal pain or nausea and vomiting, or drainage from your incision.  Follow-up in clinic in 2 to 3 weeks for symptom check and to discuss pathology results.   Discharge patient   Complete by: As directed    Discharge disposition: 01-Home or Self Care   Discharge patient date: 02/18/2020     Allergies as of 02/18/2020      Reactions   Morphine And Related Shortness Of Breath   Orange Fruit [citrus] Hives      Medication List    STOP taking these medications   sulfamethoxazole-trimethoprim 800-160 MG tablet Commonly known as: BACTRIM DS   traMADol 50 MG tablet Commonly known as: ULTRAM     TAKE these medications   docusate sodium 100 MG capsule Commonly known as: COLACE Take 1 capsule (100 mg total) by mouth 2 (two) times daily. What changed:   when to take this  reasons to take this  additional instructions   ferrous sulfate 325 (65 FE) MG EC tablet Take 1 tablet (325 mg total) by mouth 2 (two) times daily with a meal.   gabapentin 300 MG capsule Commonly known as: NEURONTIN Take 300 mg by mouth 3 (three) times daily as needed (pain.).   haloperidol decanoate 100 MG/ML injection Commonly known as: HALDOL DECANOATE Inject 150 mg into the muscle every 30 (thirty) days.  naloxone 4 MG/0.1ML Liqd nasal spray kit Commonly known as: NARCAN 1 spray into nostril upon signs of opioid overdose. Call 911. May repeat once if no response within 2-3 minutes.   oxyCODONE-acetaminophen 5-325 MG tablet Commonly known as: PERCOCET/ROXICET Take 1-2 tablets by mouth every 6 (six) hours as needed for up to 5 days for severe pain.   risperiDONE 0.5 MG tablet Commonly known as: RISPERDAL Take 0.5 mg by mouth at bedtime.   sertraline 100 MG tablet Commonly  known as: ZOLOFT Take 150 mg by mouth every morning.        Signed: Festus Aloe 02/18/2020, 2:58 PM

## 2020-02-18 NOTE — Progress Notes (Signed)
Discharge order received. Patient mental status is at baseline. Vital signs stable . No signs of acute distress. Discharge instructions given. Patient verbalized understanding. No other issues noted at this time.   

## 2020-02-19 LAB — PREPARE RBC (CROSSMATCH)

## 2020-02-21 ENCOUNTER — Telehealth: Payer: Self-pay | Admitting: *Deleted

## 2020-02-21 ENCOUNTER — Other Ambulatory Visit: Payer: Self-pay | Admitting: Anatomic Pathology & Clinical Pathology

## 2020-02-21 LAB — SURGICAL PATHOLOGY

## 2020-02-21 NOTE — Telephone Encounter (Signed)
She can take tylenol, ibuprofen, or her tramadol she already has.  Needs to go to ED if worsening SOB, CP, etc  Nickolas Madrid, MD 02/21/2020

## 2020-02-21 NOTE — Telephone Encounter (Signed)
Notified patient as instructed.   

## 2020-02-21 NOTE — Telephone Encounter (Signed)
Patient called in today and states she is having shortness of breath due to percocet that was given 4 days ago. She only Has enough for one more day. Advised her to stop taking the medication ASAP if it is making her SOB.

## 2020-03-01 ENCOUNTER — Telehealth: Payer: Self-pay

## 2020-03-01 ENCOUNTER — Other Ambulatory Visit: Payer: Medicaid Other

## 2020-03-01 DIAGNOSIS — N2889 Other specified disorders of kidney and ureter: Secondary | ICD-10-CM

## 2020-03-01 NOTE — Telephone Encounter (Signed)
Please schedule follow up and notify pt please. Lab/MD one day next week (cbc,cmp). Thank you

## 2020-03-01 NOTE — Telephone Encounter (Signed)
Done... Per Dr. Tasia Catchings to scheduled pt for a f/u lab/MD appt the week of 03/06/20 appts has been scheduled as requested and pt is aware 03/06/20 date to be seen per pt request.

## 2020-03-01 NOTE — Progress Notes (Signed)
Tumor Board Documentation  Zoe Ramos was presented by Dr Glori Luis at our Tumor Board on 03/01/2020, which included representatives from medical oncology, radiation oncology, navigation, pathology, radiology, surgical oncology, internal medicine, genetics, research, palliative care, pulmonology.  Zoe Ramos currently presents as a current patient, for new positive pathology with history of the following treatments: active survellience, surgical intervention(s).  Additionally, we reviewed previous medical and familial history, history of present illness, and recent lab results along with all available histopathologic and imaging studies. The tumor board considered available treatment options and made the following recommendations: (Follow up with Dr Tasia Catchings to be made, Has [facility] appaointment 03/29/20)    The following procedures/referrals were also placed: No orders of the defined types were placed in this encounter.   Clinical Trial Status: not discussed   Staging used: Pathologic Stage, To be determined  AJCC Staging: T: 1B N: X   Group: Clear Cell Renal Cancer   National site-specific guidelines   were discussed with respect to the case.  Tumor board is a meeting of clinicians from various specialty areas who evaluate and discuss patients for whom a multidisciplinary approach is being considered. Final determinations in the plan of care are those of the provider(s). The responsibility for follow up of recommendations given during tumor board is that of the provider.   Today's extended care, comprehensive team conference, Zoe Ramos was not present for the discussion and was not examined.   Multidisciplinary Tumor Board is a multidisciplinary case peer review process.  Decisions discussed in the Multidisciplinary Tumor Board reflect the opinions of the specialists present at the conference without having examined the patient.  Ultimately, treatment and diagnostic decisions rest with the primary  provider(s) and the patient.

## 2020-03-01 NOTE — Telephone Encounter (Signed)
-----   Message from Earlie Server, MD sent at 03/01/2020  1:30 PM EDT ----- Please arrange her to follow up with me. Recent nephrectomy, path showed RCC. Please obtain cbc and cmp- MD 15 min in person.

## 2020-03-05 ENCOUNTER — Inpatient Hospital Stay: Payer: Medicaid Other | Admitting: Oncology

## 2020-03-05 ENCOUNTER — Inpatient Hospital Stay: Payer: Medicaid Other | Attending: Oncology

## 2020-03-05 DIAGNOSIS — R590 Localized enlarged lymph nodes: Secondary | ICD-10-CM | POA: Insufficient documentation

## 2020-03-05 DIAGNOSIS — Z803 Family history of malignant neoplasm of breast: Secondary | ICD-10-CM | POA: Insufficient documentation

## 2020-03-05 DIAGNOSIS — D509 Iron deficiency anemia, unspecified: Secondary | ICD-10-CM | POA: Insufficient documentation

## 2020-03-05 DIAGNOSIS — Z800 Family history of malignant neoplasm of digestive organs: Secondary | ICD-10-CM | POA: Insufficient documentation

## 2020-03-05 DIAGNOSIS — Z8041 Family history of malignant neoplasm of ovary: Secondary | ICD-10-CM | POA: Insufficient documentation

## 2020-03-05 DIAGNOSIS — N189 Chronic kidney disease, unspecified: Secondary | ICD-10-CM | POA: Insufficient documentation

## 2020-03-05 DIAGNOSIS — Z8051 Family history of malignant neoplasm of kidney: Secondary | ICD-10-CM | POA: Insufficient documentation

## 2020-03-05 DIAGNOSIS — Z8042 Family history of malignant neoplasm of prostate: Secondary | ICD-10-CM | POA: Insufficient documentation

## 2020-03-05 DIAGNOSIS — C642 Malignant neoplasm of left kidney, except renal pelvis: Secondary | ICD-10-CM | POA: Insufficient documentation

## 2020-03-05 DIAGNOSIS — F17210 Nicotine dependence, cigarettes, uncomplicated: Secondary | ICD-10-CM | POA: Insufficient documentation

## 2020-03-06 ENCOUNTER — Inpatient Hospital Stay (HOSPITAL_BASED_OUTPATIENT_CLINIC_OR_DEPARTMENT_OTHER): Payer: Medicaid Other | Admitting: Oncology

## 2020-03-06 ENCOUNTER — Other Ambulatory Visit: Payer: Self-pay

## 2020-03-06 ENCOUNTER — Inpatient Hospital Stay: Payer: Medicaid Other

## 2020-03-06 ENCOUNTER — Encounter: Payer: Self-pay | Admitting: Oncology

## 2020-03-06 VITALS — BP 115/81 | HR 73 | Temp 96.6°F | Resp 16 | Wt 261.5 lb

## 2020-03-06 DIAGNOSIS — N189 Chronic kidney disease, unspecified: Secondary | ICD-10-CM | POA: Diagnosis not present

## 2020-03-06 DIAGNOSIS — C642 Malignant neoplasm of left kidney, except renal pelvis: Secondary | ICD-10-CM | POA: Diagnosis not present

## 2020-03-06 DIAGNOSIS — Z7189 Other specified counseling: Secondary | ICD-10-CM | POA: Diagnosis not present

## 2020-03-06 DIAGNOSIS — Z8041 Family history of malignant neoplasm of ovary: Secondary | ICD-10-CM | POA: Diagnosis not present

## 2020-03-06 DIAGNOSIS — R590 Localized enlarged lymph nodes: Secondary | ICD-10-CM | POA: Diagnosis not present

## 2020-03-06 DIAGNOSIS — Z8051 Family history of malignant neoplasm of kidney: Secondary | ICD-10-CM | POA: Diagnosis not present

## 2020-03-06 DIAGNOSIS — Z809 Family history of malignant neoplasm, unspecified: Secondary | ICD-10-CM

## 2020-03-06 DIAGNOSIS — N2889 Other specified disorders of kidney and ureter: Secondary | ICD-10-CM

## 2020-03-06 DIAGNOSIS — Z800 Family history of malignant neoplasm of digestive organs: Secondary | ICD-10-CM | POA: Diagnosis not present

## 2020-03-06 DIAGNOSIS — D509 Iron deficiency anemia, unspecified: Secondary | ICD-10-CM

## 2020-03-06 DIAGNOSIS — F17210 Nicotine dependence, cigarettes, uncomplicated: Secondary | ICD-10-CM | POA: Diagnosis not present

## 2020-03-06 DIAGNOSIS — Z8042 Family history of malignant neoplasm of prostate: Secondary | ICD-10-CM | POA: Diagnosis not present

## 2020-03-06 DIAGNOSIS — Z803 Family history of malignant neoplasm of breast: Secondary | ICD-10-CM | POA: Diagnosis not present

## 2020-03-06 LAB — COMPREHENSIVE METABOLIC PANEL
ALT: 15 U/L (ref 0–44)
AST: 16 U/L (ref 15–41)
Albumin: 3.4 g/dL — ABNORMAL LOW (ref 3.5–5.0)
Alkaline Phosphatase: 163 U/L — ABNORMAL HIGH (ref 38–126)
Anion gap: 7 (ref 5–15)
BUN: 13 mg/dL (ref 6–20)
CO2: 26 mmol/L (ref 22–32)
Calcium: 8.7 mg/dL — ABNORMAL LOW (ref 8.9–10.3)
Chloride: 104 mmol/L (ref 98–111)
Creatinine, Ser: 1.16 mg/dL — ABNORMAL HIGH (ref 0.44–1.00)
GFR calc Af Amer: >60 mL/min (ref >60)
GFR calc non Af Amer: 58 mL/min — ABNORMAL LOW (ref >60)
Glucose, Bld: 99 mg/dL (ref 70–99)
Potassium: 4.2 mmol/L (ref 3.5–5.1)
Sodium: 137 mmol/L (ref 135–145)
Total Bilirubin: 0.5 mg/dL (ref 0.3–1.2)
Total Protein: 7.5 g/dL (ref 6.5–8.1)

## 2020-03-06 LAB — CBC WITH DIFFERENTIAL/PLATELET
Abs Immature Granulocytes: 0.02 10*3/uL (ref 0.00–0.07)
Basophils Absolute: 0 10*3/uL (ref 0.0–0.1)
Basophils Relative: 1 %
Eosinophils Absolute: 0.3 10*3/uL (ref 0.0–0.5)
Eosinophils Relative: 4 %
HCT: 34.1 % — ABNORMAL LOW (ref 36.0–46.0)
Hemoglobin: 10.4 g/dL — ABNORMAL LOW (ref 12.0–15.0)
Immature Granulocytes: 0 %
Lymphocytes Relative: 32 %
Lymphs Abs: 2.4 10*3/uL (ref 0.7–4.0)
MCH: 19.4 pg — ABNORMAL LOW (ref 26.0–34.0)
MCHC: 30.5 g/dL (ref 30.0–36.0)
MCV: 63.5 fL — ABNORMAL LOW (ref 80.0–100.0)
Monocytes Absolute: 0.4 10*3/uL (ref 0.1–1.0)
Monocytes Relative: 5 %
Neutro Abs: 4.4 10*3/uL (ref 1.7–7.7)
Neutrophils Relative %: 58 %
Platelets: 428 10*3/uL — ABNORMAL HIGH (ref 150–400)
RBC: 5.37 MIL/uL — ABNORMAL HIGH (ref 3.87–5.11)
RDW: 16.9 % — ABNORMAL HIGH (ref 11.5–15.5)
WBC: 7.6 10*3/uL (ref 4.0–10.5)
nRBC: 0 % (ref 0.0–0.2)

## 2020-03-06 LAB — RETIC PANEL
Immature Retic Fract: 14.5 % (ref 2.3–15.9)
RBC.: 5.35 MIL/uL — ABNORMAL HIGH (ref 3.87–5.11)
Retic Count, Absolute: 69 10*3/uL (ref 19.0–186.0)
Retic Ct Pct: 1.3 % (ref 0.4–3.1)
Reticulocyte Hemoglobin: 22.4 pg — ABNORMAL LOW (ref >27.9)

## 2020-03-06 LAB — IRON AND TIBC
Iron: 42 ug/dL (ref 28–170)
Saturation Ratios: 11 % (ref 10.4–31.8)
TIBC: 372 ug/dL (ref 250–450)
UIBC: 330 ug/dL

## 2020-03-06 LAB — FERRITIN: Ferritin: 16 ng/mL (ref 11–307)

## 2020-03-06 NOTE — Progress Notes (Signed)
Patient has pain at the left side incision sites (pain 8/10 on pain scale today) last took Percocet last night.  Now has acid reflux especially after drinking liquid.

## 2020-03-06 NOTE — Progress Notes (Signed)
Hematology/Oncology Consult note [facility] [phone] [phone]   Patient Care Team: Center, [facility] as PCP - General (General Practice)  REFERRING PROVIDER: Center, [place] VISIT:  Follow up for RCC  HISTORY OF PRESENTING ILLNESS:   Zoe Ramos is a  44 y.o.  female with PMH listed below was seen in consultation at the request of  Center, Jenny Reichmann*  for evaluation of abnormal MRI, kidney mass Patient was accompanied by her daughter.  10/31/2019 patient had ultrasound abdomen done for evaluation of elevated alkaline phosphatase.  Her alkaline phosphatase was elevated at 158. Abdomen ultrasound shows no focal liver lesion.  Normal parenchymal echogenicity. Incidental finding of left kidney solid lesion lower pole which is worrisome for RCC. 11/14/2019 patient underwent MRI abdomen with and without contrast which showed enhancing mass arising from left mid kidney with features compatible with RCC.  No evidence of metastatic disease within the upper abdomen. Patient is currently everyday smoker, 1 pack a day for the past 20 years.   INTERVAL HISTORY Zoe Ramos is a 44 y.o. 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  pT1b pNX Patient presents for follow-up and discussion of adjuvant treatment plan and surveillance plan.  She had an appointment yesterday and she no showed.  She thought appointment was today. She reports some soreness around surgical sites.  No fever or chills, no wound discharge.  Review of Systems  Constitutional: Negative for appetite change, chills, fatigue, fever and unexpected weight change.  HENT:    Negative for hearing loss and voice change.   Eyes: Negative for eye problems.  Respiratory: Negative for chest tightness and cough.   Cardiovascular: Negative for chest pain.  Gastrointestinal: Negative for abdominal distention, abdominal pain and blood in stool.  Endocrine: Negative for hot flashes.  Genitourinary: Negative for difficulty urinating and frequency.   Musculoskeletal: Negative for arthralgias.  Skin: Negative for itching and rash.  Neurological: Positive for numbness. Negative for extremity weakness.  Hematological: Negative for adenopathy.  Psychiatric/Behavioral: Negative for confusion.    MEDICAL HISTORY:  Past Medical History:  Diagnosis Date  . Anemia   . Cancer ([place])   . GERD (gastroesophageal reflux disease)   . History of hiatal hernia   . History of trichomonal vaginitis    07/2015  . Mental disorder    bipolar  . Nausea and vomiting during pregnancy     SURGICAL HISTORY: Past Surgical History:  Procedure Laterality Date  . GALLBLADDER SURGERY    . HERNIA REPAIR     hiatal hernia and umbilical  . LAPAROSCOPIC NEPHRECTOMY, HAND ASSISTED Left 02/16/2020   Procedure: HAND ASSISTED LAPAROSCOPIC NEPHRECTOMY;  Surgeon: Billey Co, MD;  Location: [facility] ORS;  Service: Urology;  Laterality: Left;  . TUBAL LIGATION Bilateral 09/29/2015   Procedure: POST PARTUM TUBAL LIGATION;  Surgeon: Rubie Maid, MD;  Location: [facility] ORS;  Service: Gynecology;  Laterality: Bilateral;  . uterine ablasion      SOCIAL HISTORY: Social History   Socioeconomic History  . Marital status: Single    Spouse name: Not on file  . Number of children: Not on file  . Years of education: Not on file  . Highest education level:  Not on file  Occupational History  . Not on file  Tobacco Use  . Smoking status: Current Every Day Smoker    Packs/day: 1.00    Years: 20.00    Pack years: 20.00    Types: Cigarettes  . Smokeless tobacco: Never Used  Substance and Sexual Activity   . Alcohol use: No  . Drug use: No  . Sexual activity: Yes    Birth control/protection: None  Other Topics Concern  . Not on file  Social History Narrative  . Not on file   Social Determinants of Health   Financial Resource Strain:   . Difficulty of Paying Living Expenses:   Food Insecurity:   . Worried About Charity fundraiser in the Last Year:   . Arboriculturist in the Last Year:   Transportation Needs:   . Film/video editor (Medical):   Marland Kitchen Lack of Transportation (Non-Medical):   Physical Activity:   . Days of Exercise per Week:   . Minutes of Exercise per Session:   Stress:   . Feeling of Stress :   Social Connections:   . Frequency of Communication with Friends and Family:   . Frequency of Social Gatherings with Friends and Family:   . Attends Religious Services:   . Active Member of Clubs or Organizations:   . Attends Archivist Meetings:   Marland Kitchen Marital Status:   Intimate Partner Violence:   . Fear of Current or Ex-Partner:   . Emotionally Abused:   Marland Kitchen Physically Abused:   . Sexually Abused:     FAMILY HISTORY: Family History  Problem Relation Age of Onset  . Cancer Mother        ovarian, breast  . Breast cancer Mother   . Diabetes Father   . Prostate cancer Father   . Diabetes Sister   . Kidney cancer Maternal Grandmother   . Pancreatic cancer Paternal Grandfather     ALLERGIES:  is allergic to morphine and related and orange fruit [citrus].  MEDICATIONS:  Current Outpatient Medications  Medication Sig Dispense Refill  . docusate sodium (COLACE) 100 MG capsule Take 1 capsule (100 mg total) by mouth 2 (two) times daily. 14 capsule 0  . gabapentin (NEURONTIN) 300 MG capsule Take 300 mg by mouth 3 (three) times daily as needed (pain.).     Marland Kitchen oxyCODONE-acetaminophen (PERCOCET/ROXICET) 5-325 MG tablet Take 1 tablet by mouth every 4 (four) hours as needed for severe pain.    Marland Kitchen risperiDONE (RISPERDAL) 0.5 MG tablet Take 0.5 mg by mouth at  bedtime.     . risperiDONE ER (PERSERIS) 120 MG PRSY Perseris 120 mg abdominal subcutaneous ext. release suspension syringe  INJECT ONE SYRIGE ONCE Q MONTH UTD    . sertraline (ZOLOFT) 100 MG tablet Take 150 mg by mouth every morning.     . ferrous sulfate 325 (65 FE) MG EC tablet Take 1 tablet (325 mg total) by mouth 2 (two) times daily with a meal. (Patient not taking: Reported on 03/06/2020) 60 tablet 2  . haloperidol decanoate (HALDOL DECANOATE) 100 MG/ML injection Inject 150 mg into the muscle every 30 (thirty) days.     . naloxone (NARCAN) nasal spray 4 mg/0.1 mL 1 spray into nostril upon signs of opioid overdose. Call 911. May repeat once if no response within 2-3 minutes. (Patient not taking: Reported on 03/06/2020) 1 each 0   No current facility-administered medications for this visit.     PHYSICAL EXAMINATION: ECOG PERFORMANCE  STATUS: 1 - Symptomatic but completely ambulatory Vitals:   03/06/20 1106  BP: 115/81  Pulse: 73  Resp: 16  Temp: (!) 96.6 F (35.9 C)   Filed Weights   03/06/20 1106  Weight: 261 lb 8 oz (118.6 kg)    Physical Exam Constitutional:      General: She is not in acute distress. HENT:     Head: Normocephalic and atraumatic.  Eyes:     General: No scleral icterus.    Pupils: Pupils are equal, round, and reactive to light.  Cardiovascular:     Rate and Rhythm: Normal rate and regular rhythm.     Heart sounds: Normal heart sounds.  Pulmonary:     Effort: Pulmonary effort is normal. No respiratory distress.     Breath sounds: No wheezing.  Abdominal:     General: Bowel sounds are normal. There is no distension.     Palpations: Abdomen is soft. There is no mass.     Tenderness: There is no abdominal tenderness.  Musculoskeletal:        General: No deformity. Normal range of motion.     Cervical back: Normal range of motion and neck supple.  Skin:    General: Skin is warm and dry.     Findings: No erythema or rash.  Neurological:     Mental  Status: She is alert and oriented to person, place, and time.     Cranial Nerves: No cranial nerve deficit.     Coordination: Coordination normal.  Psychiatric:        Behavior: Behavior normal.        Thought Content: Thought content normal.     LABORATORY DATA:  I have reviewed the data as listed Lab Results  Component Value Date   WBC 7.6 03/06/2020   HGB 10.4 (L) 03/06/2020   HCT 34.1 (L) 03/06/2020   MCV 63.5 (L) 03/06/2020   PLT 428 (H) 03/06/2020   Recent Labs    11/21/19 1218 11/21/19 1218 02/09/20 1035 02/17/20 0558 03/06/20 0923  NA 138   < > 137 137 137  K 4.2   < > 3.9 3.9 4.2  CL 106   < > 105 105 104  CO2 26   < > 26 26 26   GLUCOSE 99   < > 111* 106* 99  BUN 14   < > 8 9 13   CREATININE 0.69   < > 0.87 1.03* 1.16*  CALCIUM 8.8*   < > 8.9 8.2* 8.7*  GFRNONAA >60   < > >60 >60 58*  GFRAA >60   < > >60 >60 >60  PROT 7.8  --   --   --  7.5  ALBUMIN 3.7  --   --   --  3.4*  AST 25  --   --   --  16  ALT 23  --   --   --  15  ALKPHOS 158*  --   --   --  163*  BILITOT 0.4  --   --   --  0.5   < > = values in this interval not displayed.   Iron/TIBC/Ferritin/ %Sat    Component Value Date/Time   IRON 42 03/06/2020 0923   IRON 63 08/18/2014 1035   TIBC 372 03/06/2020 0923   TIBC 350 08/18/2014 1035   FERRITIN 16 03/06/2020 0923   FERRITIN 5 (L) 08/10/2014 1238   IRONPCTSAT 11 03/06/2020 0923   IRONPCTSAT 18 08/18/2014 1035  RADIOGRAPHIC STUDIES: I have personally reviewed the radiological images as listed and agreed with the findings in the report.  No results found.   The insulin use ASSESSMENT & PLAN:  1. Clear cell renal cell carcinoma, left (HCC)   2. Axillary lymphadenopathy   3. Iron deficiency anemia, unspecified iron deficiency anemia type   4. Goals of care, counseling/discussion   5. Family history of cancer   Cancer Staging Clear cell renal cell carcinoma, left (HCC) Staging form: Kidney, AJCC 8th Edition - Clinical: No stage  assigned - Unsigned - Pathologic: Stage Unknown (pT1b, pNX, cM0) - Signed by Earlie Server, MD on 03/06/2020   #Left [place] Pathology was reviewed and discussed on Tumor board 03/01/20. I discussed with patient. Risk stratification: UISS score is low risk, 91% 5-year survival. I discussed with patient that adjuvant Sutent may improve PFS but has no overall survival benefit and usually is offered to patient with high risk of recurrence. Given that she has a low U ISS score, I will not offer adjuvant chemotherapy. Another reason of not offering adjuvant chemo is due to her questionable compliance to medication and medical appointments. I recommend repeat MRI abdomen in 3 months for post surgery baseline. Patient will also need CT chest annually.  #Iron deficiency anemia, in the context of anemia due to CKD. Hemoglobin has improved to 10.4, iron panel showed ferritin 16, iron saturation 11, TIBC 372. Recommend patient take oral iron supplementation.  Patient reports that she cannot tolerate oral iron supplementation. She prefers IV iron treatments.  She reports that she has been given IV iron in the past and tolerated well Plan IV iron with Venofer 200mg  weekly x 3 doses. Allergy reactions/infusion reaction including anaphylactic reaction discussed with patient. Other side effects include but not limited to high blood pressure, skin rash, weight gain, leg swelling, etc. Patient voices understanding and willing to proceed.   #Significant family history of ovarian cancer, breast cancer, pancreatic cancer, prostate cancer and RCC Refer patient to discuss with genetic counselor for genetic testing.  She agrees with the plan She was previously referred to genetic counselor and she has not establish care with them.  #Axillary lymphadenopathy, repeat ultrasound axillary bilateral per radiology recommendation.  Marland Kitchen#Smoke cessation was discussed with her.  Orders Placed This Encounter  Procedures  . MR Abdomen  W Wo Contrast    Standing Status:   Future    Standing Expiration Date:   03/06/2021    Order Specific Question:   ** REASON FOR EXAM (FREE TEXT)    Answer:   kidney cancer surveillance    Order Specific Question:   If indicated for the ordered procedure, I authorize the administration of contrast media per Radiology protocol    Answer:   Yes    Order Specific Question:   What is the patient's sedation requirement?    Answer:   No Sedation    Order Specific Question:   Does the patient have a pacemaker or implanted devices?    Answer:   No    Order Specific Question:   Radiology Contrast Protocol - do NOT remove file path    Answer:   [email or web address]    Order Specific Question:   Preferred imaging location?    Answer:   [facility] (table limit - 550lbs)  . Retic Panel    Standing Status:   Future    Number of Occurrences:   1    Standing Expiration Date:   03/06/2021  . Ferritin  Standing Status:   Future    Standing Expiration Date:   03/06/2021  . Iron and TIBC    Standing Status:   Future    Standing Expiration Date:   03/06/2021  . CBC with Differential/Platelet    Standing Status:   Future    Standing Expiration Date:   03/06/2021  . Comprehensive metabolic panel    Standing Status:   Future    Standing Expiration Date:   03/06/2021  . Iron and TIBC    Standing Status:   Future    Number of Occurrences:   1    Standing Expiration Date:   03/06/2021  . Ferritin    Standing Status:   Future    Number of Occurrences:   1    Standing Expiration Date:   03/06/2021  . Lactate dehydrogenase    Standing Status:   Future    Standing Expiration Date:   03/06/2021    All questions were answered. The patient knows to call the clinic with any problems questions or concerns.  Return of visit: 3 months  Earlie Server, MD, PhD Hematology Oncology [facility] Pager- [phone] 03/06/2020

## 2020-03-07 ENCOUNTER — Telehealth: Payer: Self-pay

## 2020-03-07 ENCOUNTER — Other Ambulatory Visit: Payer: Self-pay

## 2020-03-07 DIAGNOSIS — R590 Localized enlarged lymph nodes: Secondary | ICD-10-CM

## 2020-03-07 DIAGNOSIS — N2889 Other specified disorders of kidney and ureter: Secondary | ICD-10-CM

## 2020-03-07 NOTE — Telephone Encounter (Signed)
-----   Message from Earlie Server, MD sent at 03/06/2020  8:00 PM EDT ----- Please schedule her to have IV venofer weekly x 3. -  Refer her to genetic counselor-reason is RCC. she knows above plan.   Also please arrange her to repeat bilaterally axillary [place] as per radiology suggested.She should know that. I did not specifically discuss that. Thank you

## 2020-03-07 NOTE — Telephone Encounter (Signed)
Done..  All appts has been scheduled as requested Pt is aware

## 2020-03-12 ENCOUNTER — Inpatient Hospital Stay: Payer: Medicaid Other

## 2020-03-13 ENCOUNTER — Inpatient Hospital Stay: Admission: RE | Admit: 2020-03-13 | Payer: Medicaid Other | Source: Ambulatory Visit

## 2020-03-13 ENCOUNTER — Ambulatory Visit: Payer: Medicaid Other | Attending: Oncology

## 2020-03-14 ENCOUNTER — Ambulatory Visit: Payer: Medicaid Other | Admitting: Urology

## 2020-03-14 ENCOUNTER — Encounter: Payer: Self-pay | Admitting: Urology

## 2020-03-19 ENCOUNTER — Inpatient Hospital Stay: Payer: Medicaid Other

## 2020-03-26 ENCOUNTER — Inpatient Hospital Stay: Payer: Medicaid Other

## 2020-03-29 ENCOUNTER — Inpatient Hospital Stay: Payer: Medicaid Other | Admitting: Licensed Clinical Social Worker

## 2020-06-06 ENCOUNTER — Inpatient Hospital Stay: Payer: Medicaid Other | Attending: Oncology

## 2020-06-06 ENCOUNTER — Ambulatory Visit
Admission: RE | Admit: 2020-06-06 | Discharge: 2020-06-06 | Disposition: A | Discharge status: Home / Self Care | Payer: Medicaid Other | Source: Ambulatory Visit | Attending: Oncology | Admitting: Oncology

## 2020-06-06 ENCOUNTER — Other Ambulatory Visit: Payer: Self-pay

## 2020-06-06 DIAGNOSIS — F17210 Nicotine dependence, cigarettes, uncomplicated: Secondary | ICD-10-CM | POA: Diagnosis not present

## 2020-06-06 DIAGNOSIS — D509 Iron deficiency anemia, unspecified: Secondary | ICD-10-CM | POA: Diagnosis not present

## 2020-06-06 DIAGNOSIS — C642 Malignant neoplasm of left kidney, except renal pelvis: Secondary | ICD-10-CM

## 2020-06-06 DIAGNOSIS — Z8041 Family history of malignant neoplasm of ovary: Secondary | ICD-10-CM | POA: Insufficient documentation

## 2020-06-06 DIAGNOSIS — R590 Localized enlarged lymph nodes: Secondary | ICD-10-CM | POA: Diagnosis not present

## 2020-06-06 DIAGNOSIS — Z8042 Family history of malignant neoplasm of prostate: Secondary | ICD-10-CM | POA: Insufficient documentation

## 2020-06-06 DIAGNOSIS — N189 Chronic kidney disease, unspecified: Secondary | ICD-10-CM | POA: Diagnosis not present

## 2020-06-06 DIAGNOSIS — R748 Abnormal levels of other serum enzymes: Secondary | ICD-10-CM | POA: Diagnosis not present

## 2020-06-06 DIAGNOSIS — Z8051 Family history of malignant neoplasm of kidney: Secondary | ICD-10-CM | POA: Diagnosis not present

## 2020-06-06 DIAGNOSIS — Z905 Acquired absence of kidney: Secondary | ICD-10-CM | POA: Insufficient documentation

## 2020-06-06 DIAGNOSIS — Z803 Family history of malignant neoplasm of breast: Secondary | ICD-10-CM | POA: Insufficient documentation

## 2020-06-06 DIAGNOSIS — Z800 Family history of malignant neoplasm of digestive organs: Secondary | ICD-10-CM | POA: Insufficient documentation

## 2020-06-06 DIAGNOSIS — D631 Anemia in chronic kidney disease: Secondary | ICD-10-CM | POA: Insufficient documentation

## 2020-06-06 LAB — CBC WITH DIFFERENTIAL/PLATELET
Abs Immature Granulocytes: 0.02 10*3/uL (ref 0.00–0.07)
Basophils Absolute: 0 10*3/uL (ref 0.0–0.1)
Basophils Relative: 0 %
Eosinophils Absolute: 0.1 10*3/uL (ref 0.0–0.5)
Eosinophils Relative: 2 %
HCT: 33.6 % — ABNORMAL LOW (ref 36.0–46.0)
Hemoglobin: 10.2 g/dL — ABNORMAL LOW (ref 12.0–15.0)
Immature Granulocytes: 0 %
Lymphocytes Relative: 32 %
Lymphs Abs: 2 10*3/uL (ref 0.7–4.0)
MCH: 19 pg — ABNORMAL LOW (ref 26.0–34.0)
MCHC: 30.4 g/dL (ref 30.0–36.0)
MCV: 62.7 fL — ABNORMAL LOW (ref 80.0–100.0)
Monocytes Absolute: 0.3 10*3/uL (ref 0.1–1.0)
Monocytes Relative: 5 %
Neutro Abs: 3.7 10*3/uL (ref 1.7–7.7)
Neutrophils Relative %: 61 %
Platelets: 364 10*3/uL (ref 150–400)
RBC: 5.36 MIL/uL — ABNORMAL HIGH (ref 3.87–5.11)
RDW: 16.5 % — ABNORMAL HIGH (ref 11.5–15.5)
WBC: 6.1 10*3/uL (ref 4.0–10.5)
nRBC: 0 % (ref 0.0–0.2)

## 2020-06-06 LAB — COMPREHENSIVE METABOLIC PANEL
ALT: 13 U/L (ref 0–44)
AST: 15 U/L (ref 15–41)
Albumin: 3.4 g/dL — ABNORMAL LOW (ref 3.5–5.0)
Alkaline Phosphatase: 164 U/L — ABNORMAL HIGH (ref 38–126)
Anion gap: 6 (ref 5–15)
BUN: 12 mg/dL (ref 6–20)
CO2: 26 mmol/L (ref 22–32)
Calcium: 8.5 mg/dL — ABNORMAL LOW (ref 8.9–10.3)
Chloride: 107 mmol/L (ref 98–111)
Creatinine, Ser: 0.98 mg/dL (ref 0.44–1.00)
GFR calc Af Amer: >60 mL/min (ref >60)
GFR calc non Af Amer: >60 mL/min (ref >60)
Glucose, Bld: 99 mg/dL (ref 70–99)
Potassium: 4.2 mmol/L (ref 3.5–5.1)
Sodium: 139 mmol/L (ref 135–145)
Total Bilirubin: 0.5 mg/dL (ref 0.3–1.2)
Total Protein: 7.4 g/dL (ref 6.5–8.1)

## 2020-06-06 LAB — IRON AND TIBC
Iron: 28 ug/dL (ref 28–170)
Saturation Ratios: 8 % — ABNORMAL LOW (ref 10.4–31.8)
TIBC: 375 ug/dL (ref 250–450)
UIBC: 347 ug/dL

## 2020-06-06 LAB — FERRITIN: Ferritin: 5 ng/mL — ABNORMAL LOW (ref 11–307)

## 2020-06-06 LAB — LACTATE DEHYDROGENASE: LDH: 110 U/L (ref 98–192)

## 2020-06-06 MED ORDER — GADOBUTROL 1 MMOL/ML IV SOLN
7.5000 mL | Freq: Once | INTRAVENOUS | Status: AC | PRN
  Administered 2020-06-06: 7.5 mL via INTRAVENOUS

## 2020-06-08 ENCOUNTER — Inpatient Hospital Stay: Payer: Medicaid Other | Admitting: Oncology

## 2020-06-11 ENCOUNTER — Ambulatory Visit: Payer: Medicaid Other | Attending: Internal Medicine

## 2020-06-11 DIAGNOSIS — Z23 Encounter for immunization: Secondary | ICD-10-CM

## 2020-06-11 NOTE — Progress Notes (Signed)
   Covid-19 Vaccination Clinic  Name:  Zoe Ramos    MRN: [id number] DOB: 1976/05/01  06/11/2020  [name] was observed post Covid-19 immunization for 15 minutes without incident. She was provided with Vaccine Information Sheet and instruction to access the V-Safe system.   [name] was instructed to call 911 with any severe reactions post vaccine: Marland Kitchen Difficulty breathing  . Swelling of face and throat  . A fast heartbeat  . A bad rash all over body  . Dizziness and weakness   Immunizations Administered    Name Date Dose VIS Date Route   Pfizer COVID-19 Vaccine 06/11/2020  3:27 PM 0.3 mL 12/28/2018 Intramuscular   Manufacturer: Jessie   Lot: [id number]   [place]: [id number]

## 2020-06-13 ENCOUNTER — Encounter: Payer: Self-pay | Admitting: Oncology

## 2020-06-13 ENCOUNTER — Inpatient Hospital Stay (HOSPITAL_BASED_OUTPATIENT_CLINIC_OR_DEPARTMENT_OTHER): Payer: Medicaid Other | Admitting: Oncology

## 2020-06-13 ENCOUNTER — Other Ambulatory Visit: Payer: Self-pay

## 2020-06-13 VITALS — BP 113/82 | HR 81 | Temp 97.8°F | Resp 18 | Wt 262.9 lb

## 2020-06-13 DIAGNOSIS — C642 Malignant neoplasm of left kidney, except renal pelvis: Secondary | ICD-10-CM | POA: Diagnosis not present

## 2020-06-13 DIAGNOSIS — R590 Localized enlarged lymph nodes: Secondary | ICD-10-CM | POA: Diagnosis not present

## 2020-06-13 DIAGNOSIS — D509 Iron deficiency anemia, unspecified: Secondary | ICD-10-CM | POA: Diagnosis not present

## 2020-06-13 NOTE — Progress Notes (Signed)
Hematology/Oncology Consult note [facility] [phone] [phone]   Patient Care Team: Center, [facility] as PCP - General (General Practice) Earlie Server, MD as Consulting Physician (Oncology)  REFERRING PROVIDER: [facility], [facility] VISIT:  Follow up for RCC  HISTORY OF PRESENTING ILLNESS:   Zoe Ramos is a  44 y.o.  female with PMH listed below was seen in consultation at the request of  Center, Jenny Reichmann*  for evaluation of abnormal MRI, kidney mass Patient was accompanied by her daughter.  10/31/2019 patient had ultrasound abdomen done for evaluation of elevated alkaline phosphatase.  Her alkaline phosphatase was elevated at 158. Abdomen ultrasound shows no focal liver lesion.  Normal parenchymal echogenicity. Incidental finding of left kidney solid lesion lower pole which is worrisome for RCC. 11/14/2019 patient underwent MRI abdomen with and without contrast which showed enhancing mass arising from left mid kidney with features compatible with RCC.  No evidence of metastatic disease within the upper abdomen. Patient is currently everyday smoker, 1 pack a day for the past 20 years. 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  pT1b pNX   INTERVAL HISTORY Zoe Ramos is a 44 y.o. female who has above history reviewed by me today presents for follow up visit for management of [place]  Patient reports that she has no showed to IV Venofer treatments.  Today she has no new complaints.  She has had surveillance MRI scan during the interval. Chronic fatigue  Review of Systems  Constitutional: Positive for fatigue. Negative for appetite change, chills, fever and unexpected weight change.  HENT:   Negative for hearing loss and voice change.   Eyes: Negative for eye problems.   Respiratory: Negative for chest tightness and cough.   Cardiovascular: Negative for chest pain.  Gastrointestinal: Negative for abdominal distention, abdominal pain and blood in stool.  Endocrine: Negative for hot flashes.  Genitourinary: Negative for difficulty urinating and frequency.   Musculoskeletal: Negative for arthralgias.  Skin: Negative for itching and rash.  Neurological: Positive for numbness. Negative for extremity weakness.  Hematological: Negative for adenopathy.  Psychiatric/Behavioral: Negative for confusion.    MEDICAL HISTORY:  Past Medical History:  Diagnosis Date  . Anemia   . Cancer ([place])   . GERD (gastroesophageal reflux disease)   . History of hiatal hernia   . History of trichomonal vaginitis    07/2015  . Mental disorder    bipolar  . Nausea and vomiting during pregnancy     SURGICAL HISTORY: Past Surgical History:  Procedure Laterality Date  . GALLBLADDER SURGERY    . HERNIA REPAIR     hiatal hernia and umbilical  . LAPAROSCOPIC NEPHRECTOMY, HAND ASSISTED Left 02/16/2020   Procedure: HAND ASSISTED LAPAROSCOPIC NEPHRECTOMY;  Surgeon: Billey Co, MD;  Location: [facility] ORS;  Service: Urology;  Laterality: Left;  . TUBAL LIGATION Bilateral 09/29/2015   Procedure: POST PARTUM TUBAL LIGATION;  Surgeon: Rubie Maid, MD;  Location: [facility] ORS;  Service: Gynecology;  Laterality: Bilateral;  . uterine ablasion      SOCIAL HISTORY: Social History   Socioeconomic History  . Marital status: Single    Spouse name: Not on file  . Number of children: Not on file  . Years of education: Not on file  . Highest education level: Not on file  Occupational History  . Not on  file  Tobacco Use  . Smoking status: Current Every Day Smoker    Packs/day: 1.00    Years: 20.00    Pack years: 20.00    Types: Cigarettes  . Smokeless tobacco: Never Used  Vaping Use  . Vaping Use: Never used  Substance and Sexual Activity  . Alcohol use: No  . Drug use: No  .  Sexual activity: Yes    Birth control/protection: None  Other Topics Concern  . Not on file  Social History Narrative  . Not on file   Social Determinants of Health   Financial Resource Strain:   . Difficulty of Paying Living Expenses:   Food Insecurity:   . Worried About Charity fundraiser in the Last Year:   . Arboriculturist in the Last Year:   Transportation Needs:   . Film/video editor (Medical):   Marland Kitchen Lack of Transportation (Non-Medical):   Physical Activity:   . Days of Exercise per Week:   . Minutes of Exercise per Session:   Stress:   . Feeling of Stress :   Social Connections:   . Frequency of Communication with Friends and Family:   . Frequency of Social Gatherings with Friends and Family:   . Attends Religious Services:   . Active Member of Clubs or Organizations:   . Attends Archivist Meetings:   Marland Kitchen Marital Status:   Intimate Partner Violence:   . Fear of Current or Ex-Partner:   . Emotionally Abused:   Marland Kitchen Physically Abused:   . Sexually Abused:     FAMILY HISTORY: Family History  Problem Relation Age of Onset  . Cancer Mother        ovarian, breast  . Breast cancer Mother   . Diabetes Father   . Prostate cancer Father   . Diabetes Sister   . Kidney cancer Maternal Grandmother   . Pancreatic cancer Paternal Grandfather     ALLERGIES:  is allergic to morphine and related and orange fruit [citrus].  MEDICATIONS:  Current Outpatient Medications  Medication Sig Dispense Refill  . oxyCODONE-acetaminophen (PERCOCET/ROXICET) 5-325 MG tablet Take 1 tablet by mouth every 4 (four) hours as needed for severe pain.    . pregabalin (LYRICA) 100 MG capsule Take 100 mg by mouth 2 (two) times daily.    Marland Kitchen docusate sodium (COLACE) 100 MG capsule Take 1 capsule (100 mg total) by mouth 2 (two) times daily. (Patient not taking: Reported on 06/13/2020) 14 capsule 0  . ferrous sulfate 325 (65 FE) MG EC tablet Take 1 tablet (325 mg total) by mouth 2 (two)  times daily with a meal. (Patient not taking: Reported on 03/06/2020) 60 tablet 2  . gabapentin (NEURONTIN) 300 MG capsule Take 300 mg by mouth 3 (three) times daily as needed (pain.).  (Patient not taking: Reported on 06/13/2020)    . haloperidol decanoate (HALDOL DECANOATE) 100 MG/ML injection Inject 150 mg into the muscle every 30 (thirty) days.  (Patient not taking: Reported on 06/13/2020)    . naloxone (NARCAN) nasal spray 4 mg/0.1 mL 1 spray into nostril upon signs of opioid overdose. Call 911. May repeat once if no response within 2-3 minutes. (Patient not taking: Reported on 03/06/2020) 1 each 0  . risperiDONE (RISPERDAL) 0.5 MG tablet Take 0.5 mg by mouth at bedtime.  (Patient not taking: Reported on 06/13/2020)    . risperiDONE ER (PERSERIS) 120 MG PRSY Perseris 120 mg abdominal subcutaneous ext. release suspension syringe  INJECT ONE  SYRIGE ONCE Q MONTH UTD (Patient not taking: Reported on 06/13/2020)    . sertraline (ZOLOFT) 100 MG tablet Take 150 mg by mouth every morning.  (Patient not taking: Reported on 06/13/2020)     No current facility-administered medications for this visit.     PHYSICAL EXAMINATION: ECOG PERFORMANCE STATUS: 1 - Symptomatic but completely ambulatory Vitals:   06/13/20 1436  BP: 113/82  Pulse: 81  Resp: 18  Temp: 97.8 F (36.6 C)   Filed Weights   06/13/20 1436  Weight: 262 lb 14.4 oz (119.3 kg)    Physical Exam Constitutional:      General: She is not in acute distress. HENT:     Head: Normocephalic and atraumatic.  Eyes:     General: No scleral icterus.    Pupils: Pupils are equal, round, and reactive to light.  Cardiovascular:     Rate and Rhythm: Normal rate and regular rhythm.     Heart sounds: Normal heart sounds.  Pulmonary:     Effort: Pulmonary effort is normal. No respiratory distress.     Breath sounds: No wheezing.  Abdominal:     General: Bowel sounds are normal. There is no distension.     Palpations: Abdomen is soft. There is no  mass.     Tenderness: There is no abdominal tenderness.  Musculoskeletal:        General: No deformity. Normal range of motion.     Cervical back: Normal range of motion and neck supple.  Skin:    General: Skin is warm and dry.     Findings: No erythema or rash.  Neurological:     Mental Status: She is alert and oriented to person, place, and time. Mental status is at baseline.     Cranial Nerves: No cranial nerve deficit.     Coordination: Coordination normal.  Psychiatric:        Mood and Affect: Mood normal.     LABORATORY DATA:  I have reviewed the data as listed Lab Results  Component Value Date   WBC 6.1 06/06/2020   HGB 10.2 (L) 06/06/2020   HCT 33.6 (L) 06/06/2020   MCV 62.7 (L) 06/06/2020   PLT 364 06/06/2020   Recent Labs    11/21/19 1218 02/09/20 1035 02/17/20 0558 03/06/20 0923 06/06/20 0957  NA 138   < > 137 137 139  K 4.2   < > 3.9 4.2 4.2  CL 106   < > 105 104 107  CO2 26   < > 26 26 26   GLUCOSE 99   < > 106* 99 99  BUN 14   < > 9 13 12   CREATININE 0.69   < > 1.03* 1.16* 0.98  CALCIUM 8.8*   < > 8.2* 8.7* 8.5*  GFRNONAA >60   < > >60 58* >60  GFRAA >60   < > >60 >60 >60  PROT 7.8  --   --  7.5 7.4  ALBUMIN 3.7  --   --  3.4* 3.4*  AST 25  --   --  16 15  ALT 23  --   --  15 13  ALKPHOS 158*  --   --  163* 164*  BILITOT 0.4  --   --  0.5 0.5   < > = values in this interval not displayed.   Iron/TIBC/Ferritin/ %Sat    Component Value Date/Time   IRON 28 06/06/2020 0957   IRON 63 08/18/2014 1035   TIBC 375 06/06/2020  0957   TIBC 350 08/18/2014 1035   FERRITIN 5 (L) 06/06/2020 0957   FERRITIN 5 (L) 08/10/2014 1238   IRONPCTSAT 8 (L) 06/06/2020 0957   IRONPCTSAT 18 08/18/2014 1035      RADIOGRAPHIC STUDIES: I have personally reviewed the radiological images as listed and agreed with the findings in the report.  MR Abdomen W Wo Contrast  Result Date: 06/06/2020 CLINICAL DATA:  Follow-up left renal cell carcinoma. Status post left  nephrectomy. EXAM: MRI ABDOMEN WITHOUT AND WITH CONTRAST TECHNIQUE: Multiplanar multisequence MR imaging of the abdomen was performed both before and after the administration of intravenous contrast. CONTRAST:  7.60mL GADAVIST GADOBUTROL 1 MMOL/ML IV SOLN COMPARISON:  11/14/2019 FINDINGS: Lower chest: No acute findings. Hepatobiliary: No hepatic masses identified. Prior cholecystectomy. No evidence of biliary obstruction. Pancreas:  No mass or inflammatory changes. Spleen:  Within normal limits in size and appearance. Adrenals/Urinary Tract: Normal adrenal glands. Previous left nephrectomy. No mass identified within the nephrectomy bed. Normal appearance of the right kidney, without evidence of mass or hydronephrosis. Stomach/Bowel: Visualized portion unremarkable. Vascular/Lymphatic: No pathologically enlarged lymph nodes identified. No abdominal aortic aneurysm. Other:  None. Musculoskeletal:  No suspicious bone lesions identified. IMPRESSION: Previous left nephrectomy. No evidence of recurrent or metastatic carcinoma within the abdomen. Electronically Signed   By: Marlaine Hind M.D.   On: 06/06/2020 16:16     ASSESSMENT & PLAN:  1. Axillary lymphadenopathy   2. Clear cell renal cell carcinoma, left (HCC)   3. Iron deficiency anemia, unspecified iron deficiency anemia type   Cancer Staging Clear cell renal cell carcinoma, left (HCC) Staging form: Kidney, AJCC 8th Edition - Clinical: No stage assigned - Unsigned - Pathologic: Stage Unknown (pT1b, pNX, cM0) - Signed by Earlie Server, MD on 03/06/2020   #Left [place] Pathology was reviewed and discussed on Tumor board 03/01/20. I discussed with patient. Risk stratification: UISS score is low risk, 91% 5-year survival. No adjuvant treatment was offered. MRI was independently reviewed by me and discussed with patient.  No evidence of disease recurrence or new disease. CT scanning during January 2022   #Iron deficiency anemia, in the context of anemia due to  CKD. Hemoglobin has improved to 10.4, iron panel showed ferritin 16, iron saturation 11, TIBC 372. Patient reports that she cannot tolerate oral iron supplementation. Patient was scheduled for IV Venofer treatments and she no showed to couple of benefit treatment appointments. Repeat iron panel is consistent with severe iron deficiency.  Plan IV Venofer weekly x4.  #Significant family history of ovarian cancer, breast cancer, pancreatic cancer, prostate cancer and RCC Refer patient to discuss with genetic counselor for genetic testing.  She agrees with the plan She was previously referred to genetic counselor and she has not establish care with them.  #Axillary lymphadenopathy, repeat ultrasound axillary bilateral per radiology recommendation.-Patient no showed to ultrasound.  Marland Kitchen#Smoke cessation was discussed with her.  Orders Placed This Encounter  Procedures  . CBC with Differential/Platelet    Standing Status:   Future    Standing Expiration Date:   06/13/2021  . Ferritin    Standing Status:   Future    Standing Expiration Date:   06/13/2021  . Iron and TIBC    Standing Status:   Future    Standing Expiration Date:   06/13/2021    All questions were answered. The patient knows to call the clinic with any problems questions or concerns.  Return of visit: 3 months  Earlie Server, MD, PhD Hematology  [facility] [place]- [phone] 06/13/2020

## 2020-06-13 NOTE — Progress Notes (Signed)
Patient denies new problems/concerns today.   °

## 2020-06-21 ENCOUNTER — Inpatient Hospital Stay: Payer: Medicaid Other

## 2020-06-28 ENCOUNTER — Inpatient Hospital Stay: Payer: Medicaid Other

## 2020-07-02 ENCOUNTER — Ambulatory Visit: Payer: Medicaid Other

## 2020-07-05 ENCOUNTER — Inpatient Hospital Stay: Payer: Medicaid Other | Attending: Oncology

## 2020-07-12 ENCOUNTER — Inpatient Hospital Stay: Payer: Medicaid Other

## 2020-08-20 ENCOUNTER — Other Ambulatory Visit: Payer: Self-pay

## 2020-08-20 ENCOUNTER — Telehealth: Payer: Self-pay | Admitting: Oncology

## 2020-08-20 DIAGNOSIS — C642 Malignant neoplasm of left kidney, except renal pelvis: Secondary | ICD-10-CM

## 2020-08-20 DIAGNOSIS — D509 Iron deficiency anemia, unspecified: Secondary | ICD-10-CM

## 2020-08-20 NOTE — Telephone Encounter (Signed)
Pt called requesting Iron treatment ASAP. Team was informed of patients request based on labs completed @ the Pain center.

## 2020-08-20 NOTE — Telephone Encounter (Signed)
Dr. Dennis Bast would like for the 09/2020 appts be moved up to this week or next week.

## 2020-08-20 NOTE — Telephone Encounter (Signed)
Returned call to patient and she is requesting to have the missed Venofers r/s.  She was scheduled for weekly Venofer x4 starting on 8/11.

## 2020-08-20 NOTE — Telephone Encounter (Signed)
Done..  Pt has be sched to RTC on 08/27/20 @ 130 for labs and on 08/29/20 for MD  @ 1:00/+/- Venofer @ 1:30

## 2020-08-27 ENCOUNTER — Inpatient Hospital Stay: Payer: Medicaid Other | Attending: Oncology

## 2020-08-27 ENCOUNTER — Other Ambulatory Visit: Payer: Self-pay

## 2020-08-27 DIAGNOSIS — D631 Anemia in chronic kidney disease: Secondary | ICD-10-CM | POA: Insufficient documentation

## 2020-08-27 DIAGNOSIS — Z803 Family history of malignant neoplasm of breast: Secondary | ICD-10-CM | POA: Insufficient documentation

## 2020-08-27 DIAGNOSIS — D509 Iron deficiency anemia, unspecified: Secondary | ICD-10-CM

## 2020-08-27 DIAGNOSIS — R590 Localized enlarged lymph nodes: Secondary | ICD-10-CM | POA: Diagnosis not present

## 2020-08-27 DIAGNOSIS — F17210 Nicotine dependence, cigarettes, uncomplicated: Secondary | ICD-10-CM | POA: Insufficient documentation

## 2020-08-27 DIAGNOSIS — Z8041 Family history of malignant neoplasm of ovary: Secondary | ICD-10-CM | POA: Insufficient documentation

## 2020-08-27 DIAGNOSIS — C642 Malignant neoplasm of left kidney, except renal pelvis: Secondary | ICD-10-CM | POA: Diagnosis present

## 2020-08-27 DIAGNOSIS — N189 Chronic kidney disease, unspecified: Secondary | ICD-10-CM | POA: Insufficient documentation

## 2020-08-27 DIAGNOSIS — Z800 Family history of malignant neoplasm of digestive organs: Secondary | ICD-10-CM | POA: Diagnosis not present

## 2020-08-27 DIAGNOSIS — Z905 Acquired absence of kidney: Secondary | ICD-10-CM | POA: Diagnosis not present

## 2020-08-27 LAB — IRON AND TIBC
Iron: 49 ug/dL (ref 28–170)
Saturation Ratios: 12 % (ref 10.4–31.8)
TIBC: 407 ug/dL (ref 250–450)
UIBC: 358 ug/dL

## 2020-08-27 LAB — LACTATE DEHYDROGENASE: LDH: 122 U/L (ref 98–192)

## 2020-08-27 LAB — CBC WITH DIFFERENTIAL/PLATELET
Abs Immature Granulocytes: 0.01 10*3/uL (ref 0.00–0.07)
Basophils Absolute: 0 10*3/uL (ref 0.0–0.1)
Basophils Relative: 0 %
Eosinophils Absolute: 0.2 10*3/uL (ref 0.0–0.5)
Eosinophils Relative: 2 %
HCT: 34.5 % — ABNORMAL LOW (ref 36.0–46.0)
Hemoglobin: 10.5 g/dL — ABNORMAL LOW (ref 12.0–15.0)
Immature Granulocytes: 0 %
Lymphocytes Relative: 32 %
Lymphs Abs: 2.2 10*3/uL (ref 0.7–4.0)
MCH: 19 pg — ABNORMAL LOW (ref 26.0–34.0)
MCHC: 30.4 g/dL (ref 30.0–36.0)
MCV: 62.5 fL — ABNORMAL LOW (ref 80.0–100.0)
Monocytes Absolute: 0.3 10*3/uL (ref 0.1–1.0)
Monocytes Relative: 5 %
Neutro Abs: 4.2 10*3/uL (ref 1.7–7.7)
Neutrophils Relative %: 61 %
Platelets: 414 10*3/uL — ABNORMAL HIGH (ref 150–400)
RBC: 5.52 MIL/uL — ABNORMAL HIGH (ref 3.87–5.11)
RDW: 18 % — ABNORMAL HIGH (ref 11.5–15.5)
WBC: 7 10*3/uL (ref 4.0–10.5)
nRBC: 0 % (ref 0.0–0.2)

## 2020-08-27 LAB — COMPREHENSIVE METABOLIC PANEL
ALT: 18 U/L (ref 0–44)
AST: 21 U/L (ref 15–41)
Albumin: 3.5 g/dL (ref 3.5–5.0)
Alkaline Phosphatase: 133 U/L — ABNORMAL HIGH (ref 38–126)
Anion gap: 4 — ABNORMAL LOW (ref 5–15)
BUN: 12 mg/dL (ref 6–20)
CO2: 25 mmol/L (ref 22–32)
Calcium: 8.1 mg/dL — ABNORMAL LOW (ref 8.9–10.3)
Chloride: 106 mmol/L (ref 98–111)
Creatinine, Ser: 0.99 mg/dL (ref 0.44–1.00)
GFR, Estimated: >60 mL/min (ref >60)
Glucose, Bld: 86 mg/dL (ref 70–99)
Potassium: 4.1 mmol/L (ref 3.5–5.1)
Sodium: 135 mmol/L (ref 135–145)
Total Bilirubin: 0.3 mg/dL (ref 0.3–1.2)
Total Protein: 7.7 g/dL (ref 6.5–8.1)

## 2020-08-27 LAB — FERRITIN: Ferritin: 9 ng/mL — ABNORMAL LOW (ref 11–307)

## 2020-08-29 ENCOUNTER — Inpatient Hospital Stay (HOSPITAL_BASED_OUTPATIENT_CLINIC_OR_DEPARTMENT_OTHER): Payer: Medicaid Other | Admitting: Oncology

## 2020-08-29 ENCOUNTER — Encounter: Payer: Self-pay | Admitting: Oncology

## 2020-08-29 ENCOUNTER — Other Ambulatory Visit: Payer: Self-pay

## 2020-08-29 ENCOUNTER — Inpatient Hospital Stay: Payer: Medicaid Other

## 2020-08-29 VITALS — BP 110/79 | HR 85 | Temp 98.1°F | Resp 18 | Wt 252.2 lb

## 2020-08-29 DIAGNOSIS — Z809 Family history of malignant neoplasm, unspecified: Secondary | ICD-10-CM | POA: Diagnosis not present

## 2020-08-29 DIAGNOSIS — D509 Iron deficiency anemia, unspecified: Secondary | ICD-10-CM

## 2020-08-29 DIAGNOSIS — C642 Malignant neoplasm of left kidney, except renal pelvis: Secondary | ICD-10-CM | POA: Diagnosis not present

## 2020-08-29 DIAGNOSIS — R590 Localized enlarged lymph nodes: Secondary | ICD-10-CM | POA: Diagnosis not present

## 2020-08-29 DIAGNOSIS — N189 Chronic kidney disease, unspecified: Secondary | ICD-10-CM | POA: Diagnosis not present

## 2020-08-29 MED ORDER — IRON SUCROSE 20 MG/ML IV SOLN
200.0000 mg | Freq: Once | INTRAVENOUS | Status: AC
  Administered 2020-08-29: 200 mg via INTRAVENOUS
  Filled 2020-08-29: qty 10

## 2020-08-29 MED ORDER — SODIUM CHLORIDE 0.9 % IV SOLN
Freq: Once | INTRAVENOUS | Status: AC
  Filled 2020-08-29: qty 250

## 2020-08-29 NOTE — Progress Notes (Signed)
Patient has a 10 lb wt loss and reports a decrease in appetite.  She does have chronic bilateral leg pain that is followed by another practice.

## 2020-08-29 NOTE — Progress Notes (Signed)
Hematology/Oncology Consult note [facility] [address] Telephone:([phone] [phone]   Patient Care Team: [facility], [facility] as PCP - General (General Practice) Earlie Server, MD as Consulting Physician (Oncology)  REFERRING PROVIDER: [facility], [place] COMPLAINTS/REASON FOR VISIT:  Follow up for RCC, iron deficiency anemia  HISTORY OF PRESENTING ILLNESS:   Zoe Ramos is a  44 y.o.  female with PMH listed below was seen in consultation at the request of  Center, Jenny [facility]*  for evaluation of abnormal MRI, kidney mass Patient was accompanied by her daughter.  10/31/2019 patient had ultrasound abdomen done for evaluation of elevated alkaline phosphatase.  Her alkaline phosphatase was elevated at 158. Abdomen ultrasound shows no focal liver lesion.  Normal parenchymal echogenicity. Incidental finding of left kidney solid lesion lower pole which is worrisome for RCC. 11/14/2019 patient underwent MRI abdomen with and without contrast which showed enhancing mass arising from left mid kidney with features compatible with RCC.  No evidence of metastatic disease within the upper abdomen. Patient is currently everyday smoker, 1 pack a day for the past 20 years. 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  pT1b pNX   INTERVAL HISTORY Zoe Ramos is a 44 y.o. female who has above history reviewed by me today presents for follow up visit for management of RCC, iron deficiency anemia  Patient reports not feeling well. Per patient, lots of her medication has been " cut off" by her pain clinic. She was told that those medication may harm her kidney function. She has decreased appetite. Lost 10 pounds since last visit.  Heavy menstrual period   Review of Systems  Constitutional: Positive for fatigue. Negative for appetite  change, chills, fever and unexpected weight change.  HENT:   Negative for hearing loss and voice change.   Eyes: Negative for eye problems.  Respiratory: Negative for chest tightness and cough.   Cardiovascular: Negative for chest pain.  Gastrointestinal: Negative for abdominal distention, abdominal pain and blood in stool.  Endocrine: Negative for hot flashes.  Genitourinary: Negative for difficulty urinating and frequency.   Musculoskeletal: Positive for arthralgias.  Skin: Negative for itching and rash.  Neurological: Positive for numbness. Negative for extremity weakness.  Hematological: Negative for adenopathy.  Psychiatric/Behavioral: Negative for confusion.    MEDICAL HISTORY:  Past Medical History:  Diagnosis Date  . Anemia   . Cancer ([place])   . GERD (gastroesophageal reflux disease)   . History of hiatal hernia   . History of trichomonal vaginitis    07/2015  . Mental disorder    bipolar  . Nausea and vomiting during pregnancy     SURGICAL HISTORY: Past Surgical History:  Procedure Laterality Date  . GALLBLADDER SURGERY    . HERNIA REPAIR     hiatal hernia and umbilical  . LAPAROSCOPIC NEPHRECTOMY, HAND ASSISTED Left 02/16/2020   Procedure: HAND ASSISTED LAPAROSCOPIC NEPHRECTOMY;  Surgeon: Billey Co, MD;  Location: [facility] ORS;  Service: Urology;  Laterality: Left;  . TUBAL LIGATION Bilateral 09/29/2015   Procedure: POST PARTUM TUBAL LIGATION;  Surgeon: Rubie Maid, MD;  Location: [facility] ORS;  Service: Gynecology;  Laterality: Bilateral;  . uterine ablasion      SOCIAL HISTORY: Social History   Socioeconomic History  . Marital status: Single    Spouse name: Not on file  . Number of children: Not on file  .  Years of education: Not on file  . Highest education level: Not on file  Occupational History  . Not on file  Tobacco Use  . Smoking status: Current Every Day Smoker    Packs/day: 1.00    Years: 20.00    Pack years: 20.00    Types: Cigarettes  .  Smokeless tobacco: Never Used  Vaping Use  . Vaping Use: Never used  Substance and Sexual Activity  . Alcohol use: No  . Drug use: No  . Sexual activity: Yes    Birth control/protection: None  Other Topics Concern  . Not on file  Social History Narrative  . Not on file   Social Determinants of Health   Financial Resource Strain:   . Difficulty of Paying Living Expenses: Not on file  Food Insecurity:   . Worried About Charity fundraiser in the Last Year: Not on file  . Ran Out of Food in the Last Year: Not on file  Transportation Needs:   . Lack of Transportation (Medical): Not on file  . Lack of Transportation (Non-Medical): Not on file  Physical Activity:   . Days of Exercise per Week: Not on file  . Minutes of Exercise per Session: Not on file  Stress:   . Feeling of Stress : Not on file  Social Connections:   . Frequency of Communication with Friends and Family: Not on file  . Frequency of Social Gatherings with Friends and Family: Not on file  . Attends Religious Services: Not on file  . Active Member of Clubs or Organizations: Not on file  . Attends Archivist Meetings: Not on file  . Marital Status: Not on file  Intimate Partner Violence:   . Fear of Current or Ex-Partner: Not on file  . Emotionally Abused: Not on file  . Physically Abused: Not on file  . Sexually Abused: Not on file    FAMILY HISTORY: Family History  Problem Relation Age of Onset  . Cancer Mother        ovarian, breast  . Breast cancer Mother   . Diabetes Father   . Prostate cancer Father   . Diabetes Sister   . Kidney cancer Maternal Grandmother   . Pancreatic cancer Paternal Grandfather     ALLERGIES:  is allergic to morphine and related and orange fruit [citrus].  MEDICATIONS:  Current Outpatient Medications  Medication Sig Dispense Refill  . gabapentin (NEURONTIN) 300 MG capsule Take 300 mg by mouth 3 (three) times daily.     Marland Kitchen oxyCODONE-acetaminophen  (PERCOCET/ROXICET) 5-325 MG tablet Take 1 tablet by mouth every 4 (four) hours as needed for severe pain.    Marland Kitchen docusate sodium (COLACE) 100 MG capsule Take 1 capsule (100 mg total) by mouth 2 (two) times daily. (Patient not taking: Reported on 06/13/2020) 14 capsule 0  . ferrous sulfate 325 (65 FE) MG EC tablet Take 1 tablet (325 mg total) by mouth 2 (two) times daily with a meal. (Patient not taking: Reported on 03/06/2020) 60 tablet 2  . haloperidol decanoate (HALDOL DECANOATE) 100 MG/ML injection Inject 150 mg into the muscle every 30 (thirty) days.  (Patient not taking: Reported on 06/13/2020)    . naloxone (NARCAN) nasal spray 4 mg/0.1 mL 1 spray into nostril upon signs of opioid overdose. Call 911. May repeat once if no response within 2-3 minutes. (Patient not taking: Reported on 03/06/2020) 1 each 0  . pregabalin (LYRICA) 100 MG capsule Take 100 mg by mouth 2 (  two) times daily. (Patient not taking: Reported on 08/29/2020)    . risperiDONE (RISPERDAL) 0.5 MG tablet Take 0.5 mg by mouth at bedtime.  (Patient not taking: Reported on 06/13/2020)    . risperiDONE ER (PERSERIS) 120 MG PRSY Perseris 120 mg abdominal subcutaneous ext. release suspension syringe  INJECT ONE SYRIGE ONCE Q MONTH UTD (Patient not taking: Reported on 06/13/2020)    . sertraline (ZOLOFT) 100 MG tablet Take 150 mg by mouth every morning.  (Patient not taking: Reported on 06/13/2020)     No current facility-administered medications for this visit.     PHYSICAL EXAMINATION: ECOG PERFORMANCE STATUS: 1 - Symptomatic but completely ambulatory Vitals:   08/29/20 1258  BP: 110/79  Pulse: 85  Resp: 18  Temp: 98.1 F (36.7 C)   Filed Weights   08/29/20 1258  Weight: 252 lb 3.2 oz (114.4 kg)    Physical Exam Constitutional:      General: She is not in acute distress.    Appearance: She is obese.  HENT:     Head: Normocephalic and atraumatic.  Eyes:     General: No scleral icterus.    Pupils: Pupils are equal, round, and  reactive to light.  Cardiovascular:     Rate and Rhythm: Normal rate and regular rhythm.     Heart sounds: Normal heart sounds.  Pulmonary:     Effort: Pulmonary effort is normal. No respiratory distress.     Breath sounds: No wheezing.  Abdominal:     General: Bowel sounds are normal. There is no distension.     Palpations: Abdomen is soft. There is no mass.     Tenderness: There is no abdominal tenderness.  Musculoskeletal:        General: No deformity. Normal range of motion.     Cervical back: Normal range of motion and neck supple.  Skin:    General: Skin is warm and dry.     Findings: No erythema or rash.  Neurological:     Mental Status: She is alert and oriented to person, place, and time. Mental status is at baseline.     Cranial Nerves: No cranial nerve deficit.     Coordination: Coordination normal.  Psychiatric:        Mood and Affect: Mood normal.     LABORATORY DATA:  I have reviewed the data as listed Lab Results  Component Value Date   WBC 7.0 08/27/2020   HGB 10.5 (L) 08/27/2020   HCT 34.5 (L) 08/27/2020   MCV 62.5 (L) 08/27/2020   PLT 414 (H) 08/27/2020   Recent Labs    11/21/19 1218 02/17/20 0558 03/06/20 0923 06/06/20 0957 08/27/20 1331  NA   < > 137 137 139 135  K   < > 3.9 4.2 4.2 4.1  CL   < > 105 104 107 106  CO2   < > 26 26 26 25   GLUCOSE   < > 106* 99 99 86  BUN   < > 9 13 12 12   CREATININE   < > 1.03* 1.16* 0.98 0.99  CALCIUM   < > 8.2* 8.7* 8.5* 8.1*  GFRNONAA   < > >60 58* >60 >60  GFRAA  --  >60 >60 >60  --   PROT   < >  --  7.5 7.4 7.7  ALBUMIN   < >  --  3.4* 3.4* 3.5  AST   < >  --  16 15 21   ALT   < >  --  15 13 18   ALKPHOS   < >  --  163* 164* 133*  BILITOT   < >  --  0.5 0.5 0.3   < > = values in this interval not displayed.   Iron/TIBC/Ferritin/ %Sat    Component Value Date/Time   IRON 49 08/27/2020 1331   IRON 63 08/18/2014 1035   TIBC 407 08/27/2020 1331   TIBC 350 08/18/2014 1035   FERRITIN 9 (L) 08/27/2020  1331   FERRITIN 5 (L) 08/10/2014 1238   IRONPCTSAT 12 08/27/2020 1331   IRONPCTSAT 18 08/18/2014 1035      RADIOGRAPHIC STUDIES: I have personally reviewed the radiological images as listed and agreed with the findings in the report.  No results found.   ASSESSMENT & PLAN:  1. Clear cell renal cell carcinoma, left (HCC)   2. Iron deficiency anemia, unspecified iron deficiency anemia type   3. Axillary lymphadenopathy   4. Family history of cancer   Cancer Staging Clear cell renal cell carcinoma, left (HCC) Staging form: Kidney, AJCC 8th Edition - Clinical: No stage assigned - Unsigned - Pathologic: Stage Unknown (pT1b, pNX, cM0) - Signed by Earlie Server, MD on 03/06/2020   #Left [place] [facility]ology was reviewed and discussed on Tumor board 03/01/20. I discussed with patient. Risk stratification: UISS score is low risk, 91% 5-year survival. No adjuvant treatment was offered. Plan CT surveillance scan in January 2022.  #Iron deficiency anemia, in the context of anemia due to CKD. Labs are reviewed and discussed with patient. Iron panel showed ferritin of 9, iron saturation 12, TIBC 400 07. Hemoglobin is 10.5, MCV 62.5. Consistent with iron deficiency anemia. Recommend IV Venofer weekly x3 source of the iron deficiency likely due to heavy menses. Recommend patient to follow-up with GYN.  #Significant family history of ovarian cancer, breast cancer, pancreatic cancer, prostate cancer and RCC She was previously referred multiple times to genetic counselor and she has not establish care yet  #Axillary lymphadenopathy, repeat ultrasound axillary bilateral per radiology recommendation.- Patient no showed multiple times to her ultrasound appointments.  Marland Kitchen#Smoke cessation was discussed with her.  Orders Placed This Encounter  Procedures  . CT Chest Wo Contrast    Standing Status:   Future    Standing Expiration Date:   08/29/2021    Order Specific Question:   Is patient pregnant?     Answer:   No    Order Specific Question:   Preferred imaging location?    Answer:   [facility]  . CT Abdomen Pelvis Wo Contrast    Standing Status:   Future    Standing Expiration Date:   08/29/2021    Order Specific Question:   Is patient pregnant?    Answer:   No    Order Specific Question:   Preferred imaging location?    Answer:   [facility]    Order Specific Question:   Is Oral Contrast requested for this exam?    Answer:   Yes, Per Radiology protocol  . CBC with Differential/Platelet    Standing Status:   Future    Standing Expiration Date:   08/29/2021  . Comprehensive metabolic panel    Standing Status:   Future    Standing Expiration Date:   08/29/2021  . Ferritin    Standing Status:   Future    Standing Expiration Date:   08/29/2021  . Iron and TIBC    Standing Status:   Future    Standing Expiration Date:  08/29/2021  . Lactate dehydrogenase    Standing Status:   Future    Standing Expiration Date:   08/29/2021    All questions were answered. The patient knows to call the clinic with any problems questions or concerns.  Return of visit: 3 months  Earlie Server, MD, PhD Hematology Oncology [facility] at [facility] Pager- [phone] 08/29/2020

## 2020-09-07 ENCOUNTER — Inpatient Hospital Stay: Payer: Medicaid Other | Attending: Oncology

## 2020-09-12 ENCOUNTER — Other Ambulatory Visit: Payer: Medicaid Other

## 2020-09-14 ENCOUNTER — Ambulatory Visit: Payer: Medicaid Other

## 2020-09-14 ENCOUNTER — Inpatient Hospital Stay: Payer: Medicaid Other | Attending: Oncology

## 2020-09-14 ENCOUNTER — Ambulatory Visit: Payer: Medicaid Other | Admitting: Oncology

## 2020-09-14 VITALS — BP 124/79 | HR 76 | Temp 98.7°F | Resp 20

## 2020-09-14 DIAGNOSIS — C642 Malignant neoplasm of left kidney, except renal pelvis: Secondary | ICD-10-CM | POA: Diagnosis not present

## 2020-09-14 DIAGNOSIS — D509 Iron deficiency anemia, unspecified: Secondary | ICD-10-CM | POA: Diagnosis present

## 2020-09-14 MED ORDER — IRON SUCROSE 20 MG/ML IV SOLN
200.0000 mg | Freq: Once | INTRAVENOUS | Status: AC
  Administered 2020-09-14: 200 mg via INTRAVENOUS
  Filled 2020-09-14: qty 10

## 2020-09-14 MED ORDER — SODIUM CHLORIDE 0.9 % IV SOLN
Freq: Once | INTRAVENOUS | Status: AC
  Filled 2020-09-14: qty 250

## 2020-10-04 ENCOUNTER — Inpatient Hospital Stay: Payer: Medicaid Other | Attending: Oncology

## 2020-10-04 VITALS — BP 114/65 | HR 80 | Temp 98.0°F | Resp 19

## 2020-10-04 DIAGNOSIS — C642 Malignant neoplasm of left kidney, except renal pelvis: Secondary | ICD-10-CM | POA: Insufficient documentation

## 2020-10-04 DIAGNOSIS — D509 Iron deficiency anemia, unspecified: Secondary | ICD-10-CM | POA: Insufficient documentation

## 2020-10-04 MED ORDER — SODIUM CHLORIDE 0.9 % IV SOLN
Freq: Once | INTRAVENOUS | Status: AC
  Filled 2020-10-04: qty 250

## 2020-10-04 MED ORDER — IRON SUCROSE 20 MG/ML IV SOLN
200.0000 mg | Freq: Once | INTRAVENOUS | Status: AC
  Administered 2020-10-04: 200 mg via INTRAVENOUS
  Filled 2020-10-04: qty 10

## 2020-10-04 NOTE — Progress Notes (Signed)
Pt tolerated venofer infusion well with no signs of complications. VSS. RN educated pt on the importance of notifying the clinic if any complications occur at home, pt verbalized understanding and all questions answered at this time. Pt stable for discharge.   Zoe Ramos [employer]

## 2020-11-05 ENCOUNTER — Telehealth: Payer: Self-pay | Admitting: *Deleted

## 2020-11-05 NOTE — Telephone Encounter (Signed)
Patient called reporting that she was seen by another doctor and that she had labs done and that her kidneys and liver are "shutting down" She requests a return call to discuss this

## 2020-11-05 NOTE — Telephone Encounter (Signed)
Contacted [employer] and spoke to [place], who stated that she would forward request to referral coordinator for her to fax last office visit notes to our office.

## 2020-11-05 NOTE — Telephone Encounter (Signed)
Team please obtain her last note from [facility], then may move up her appt.

## 2020-11-05 NOTE — Telephone Encounter (Signed)
She saw [facility] Dr Newton Pigg she said something about dialysis, but not sure

## 2020-11-05 NOTE — Telephone Encounter (Signed)
Did she have any recent admission, office visit? I dont see any new labs. Can you ask her to provide more informtation? And what plan her doctor suggest for "kidney and liver shutting down?

## 2020-11-06 ENCOUNTER — Other Ambulatory Visit: Payer: Self-pay

## 2020-11-06 ENCOUNTER — Emergency Department: Payer: Medicaid Other

## 2020-11-06 ENCOUNTER — Encounter: Payer: Self-pay | Admitting: Radiology

## 2020-11-06 ENCOUNTER — Observation Stay
Admission: EM | Admit: 2020-11-06 | Discharge: 2020-11-06 | Disposition: A | Discharge status: Home / Self Care | Payer: Medicaid Other | Attending: Internal Medicine | Admitting: Internal Medicine

## 2020-11-06 DIAGNOSIS — R7401 Elevation of levels of liver transaminase levels: Secondary | ICD-10-CM | POA: Diagnosis not present

## 2020-11-06 DIAGNOSIS — Z79899 Other long term (current) drug therapy: Secondary | ICD-10-CM | POA: Diagnosis not present

## 2020-11-06 DIAGNOSIS — B179 Acute viral hepatitis, unspecified: Secondary | ICD-10-CM

## 2020-11-06 DIAGNOSIS — Z85528 Personal history of other malignant neoplasm of kidney: Secondary | ICD-10-CM

## 2020-11-06 DIAGNOSIS — R7989 Other specified abnormal findings of blood chemistry: Secondary | ICD-10-CM | POA: Diagnosis not present

## 2020-11-06 DIAGNOSIS — R0602 Shortness of breath: Secondary | ICD-10-CM | POA: Diagnosis present

## 2020-11-06 DIAGNOSIS — Z720 Tobacco use: Secondary | ICD-10-CM

## 2020-11-06 DIAGNOSIS — F250 Schizoaffective disorder, bipolar type: Secondary | ICD-10-CM | POA: Diagnosis not present

## 2020-11-06 DIAGNOSIS — R1010 Upper abdominal pain, unspecified: Secondary | ICD-10-CM

## 2020-11-06 DIAGNOSIS — F17210 Nicotine dependence, cigarettes, uncomplicated: Secondary | ICD-10-CM | POA: Diagnosis not present

## 2020-11-06 DIAGNOSIS — U071 COVID-19: Principal | ICD-10-CM | POA: Insufficient documentation

## 2020-11-06 LAB — HEPATITIS PANEL, ACUTE
HCV Ab: NONREACTIVE
Hep A IgM: NONREACTIVE
Hep B C IgM: REACTIVE — AB
Hepatitis B Surface Ag: REACTIVE — AB

## 2020-11-06 LAB — TROPONIN I (HIGH SENSITIVITY)
Troponin I (High Sensitivity): 10 ng/L (ref <18)
Troponin I (High Sensitivity): 9 ng/L (ref <18)

## 2020-11-06 LAB — CBC
HCT: 36.8 % (ref 36.0–46.0)
HCT: 33.8 % — ABNORMAL LOW (ref 36.0–46.0)
Hemoglobin: 11.5 g/dL — ABNORMAL LOW (ref 12.0–15.0)
Hemoglobin: 10.9 g/dL — ABNORMAL LOW (ref 12.0–15.0)
MCH: 19.5 pg — ABNORMAL LOW (ref 26.0–34.0)
MCH: 19.7 pg — ABNORMAL LOW (ref 26.0–34.0)
MCHC: 31.3 g/dL (ref 30.0–36.0)
MCHC: 32.2 g/dL (ref 30.0–36.0)
MCV: 62.3 fL — ABNORMAL LOW (ref 80.0–100.0)
MCV: 61.1 fL — ABNORMAL LOW (ref 80.0–100.0)
Platelets: 318 10*3/uL (ref 150–400)
Platelets: 293 10*3/uL (ref 150–400)
RBC: 5.91 MIL/uL — ABNORMAL HIGH (ref 3.87–5.11)
RBC: 5.53 MIL/uL — ABNORMAL HIGH (ref 3.87–5.11)
RDW: 19 % — ABNORMAL HIGH (ref 11.5–15.5)
RDW: 18.9 % — ABNORMAL HIGH (ref 11.5–15.5)
WBC: 6.5 10*3/uL (ref 4.0–10.5)
WBC: 7.4 10*3/uL (ref 4.0–10.5)
nRBC: 0 % (ref 0.0–0.2)
nRBC: 0 % (ref 0.0–0.2)

## 2020-11-06 LAB — COMPREHENSIVE METABOLIC PANEL
ALT: 771 U/L — ABNORMAL HIGH (ref 0–44)
ALT: 693 U/L — ABNORMAL HIGH (ref 0–44)
AST: 671 U/L — ABNORMAL HIGH (ref 15–41)
AST: 636 U/L — ABNORMAL HIGH (ref 15–41)
Albumin: 3.2 g/dL — ABNORMAL LOW (ref 3.5–5.0)
Albumin: 3 g/dL — ABNORMAL LOW (ref 3.5–5.0)
Alkaline Phosphatase: 543 U/L — ABNORMAL HIGH (ref 38–126)
Alkaline Phosphatase: 499 U/L — ABNORMAL HIGH (ref 38–126)
Anion gap: 8 (ref 5–15)
Anion gap: 6 (ref 5–15)
BUN: 8 mg/dL (ref 6–20)
BUN: 7 mg/dL (ref 6–20)
CO2: 25 mmol/L (ref 22–32)
CO2: 25 mmol/L (ref 22–32)
Calcium: 8.8 mg/dL — ABNORMAL LOW (ref 8.9–10.3)
Calcium: 8.6 mg/dL — ABNORMAL LOW (ref 8.9–10.3)
Chloride: 102 mmol/L (ref 98–111)
Chloride: 104 mmol/L (ref 98–111)
Creatinine, Ser: 0.84 mg/dL (ref 0.44–1.00)
Creatinine, Ser: 0.87 mg/dL (ref 0.44–1.00)
GFR, Estimated: >60 mL/min (ref >60)
GFR, Estimated: >60 mL/min (ref >60)
Glucose, Bld: 100 mg/dL — ABNORMAL HIGH (ref 70–99)
Glucose, Bld: 94 mg/dL (ref 70–99)
Potassium: 4.1 mmol/L (ref 3.5–5.1)
Potassium: 4.1 mmol/L (ref 3.5–5.1)
Sodium: 135 mmol/L (ref 135–145)
Sodium: 135 mmol/L (ref 135–145)
Total Bilirubin: 1.5 mg/dL — ABNORMAL HIGH (ref 0.3–1.2)
Total Bilirubin: 1.5 mg/dL — ABNORMAL HIGH (ref 0.3–1.2)
Total Protein: 7.6 g/dL (ref 6.5–8.1)
Total Protein: 7.2 g/dL (ref 6.5–8.1)

## 2020-11-06 LAB — APTT: aPTT: 36 seconds (ref 24–36)

## 2020-11-06 LAB — LIPASE, BLOOD: Lipase: 52 U/L — ABNORMAL HIGH (ref 11–51)

## 2020-11-06 LAB — PROTIME-INR
INR: 1 (ref 0.8–1.2)
Prothrombin Time: 12.7 seconds (ref 11.4–15.2)

## 2020-11-06 LAB — SARS CORONAVIRUS 2 (TAT 6-24 HRS): SARS Coronavirus 2: POSITIVE — AB

## 2020-11-06 LAB — HIV ANTIBODY (ROUTINE TESTING W REFLEX): HIV Screen 4th Generation wRfx: NONREACTIVE

## 2020-11-06 MED ORDER — MAGNESIUM HYDROXIDE 400 MG/5ML PO SUSP: 30.0000 mL | Freq: Every day | ORAL | Status: DC | PRN

## 2020-11-06 MED ORDER — ACETAMINOPHEN 650 MG RE SUPP: 650.0000 mg | Freq: Four times a day (QID) | RECTAL | Status: DC | PRN

## 2020-11-06 MED ORDER — SODIUM CHLORIDE 0.9 % IV SOLN: INTRAVENOUS | Status: DC

## 2020-11-06 MED ORDER — SODIUM CHLORIDE 0.9 % IV BOLUS
500.0000 mL | Freq: Once | INTRAVENOUS | Status: AC
  Administered 2020-11-06: 500 mL via INTRAVENOUS

## 2020-11-06 MED ORDER — ENOXAPARIN SODIUM 60 MG/0.6ML ~~LOC~~ SOLN
60.0000 mg | SUBCUTANEOUS | Status: DC
  Administered 2020-11-06: 60 mg via SUBCUTANEOUS
  Filled 2020-11-06: qty 0.6

## 2020-11-06 MED ORDER — TRAZODONE HCL 50 MG PO TABS: 25.0000 mg | ORAL_TABLET | Freq: Every evening | ORAL | Status: DC | PRN

## 2020-11-06 MED ORDER — ONDANSETRON HCL 4 MG/2ML IJ SOLN: 4.0000 mg | Freq: Four times a day (QID) | INTRAMUSCULAR | Status: DC | PRN

## 2020-11-06 MED ORDER — ONDANSETRON HCL 4 MG PO TABS: 4.0000 mg | ORAL_TABLET | Freq: Four times a day (QID) | ORAL | Status: DC | PRN

## 2020-11-06 MED ORDER — KETOROLAC TROMETHAMINE 30 MG/ML IJ SOLN: 15.0000 mg | Freq: Four times a day (QID) | INTRAMUSCULAR | Status: DC | PRN

## 2020-11-06 MED ORDER — ACETAMINOPHEN 325 MG PO TABS: 650.0000 mg | ORAL_TABLET | Freq: Four times a day (QID) | ORAL | Status: DC | PRN

## 2020-11-06 NOTE — ED Notes (Signed)
ED Provider at bedside. 

## 2020-11-06 NOTE — Discharge Summary (Signed)
[place] [facility] [place] NAME: Zoe Ramos    MR#:  [id number]  DATE OF BIRTH:  Jun 28, 1976  DATE OF ADMISSION:  11/06/2020 ADMITTING PHYSICIAN: Christel Mormon, MD  DATE OF DISCHARGE: 11/06/2020  PRIMARY CARE PHYSICIAN: [facility], [place]    ADMISSION DIAGNOSIS:  Acute hepatitis [B17.9]  DISCHARGE DIAGNOSIS:  Acute Hepatitis--unclear etio  SECONDARY DIAGNOSIS:   Past Medical History:  Diagnosis Date  . Anemia   . Cancer ([place])   . GERD (gastroesophageal reflux disease)   . History of hiatal hernia   . History of trichomonal vaginitis    07/2015  . Mental disorder    bipolar  . Nausea and vomiting during pregnancy     HOSPITAL COURSE:  Zoe Ramos  is a 45 y.o. African-American female with a known history of renal cell carcinoma status post left nephrectomy in June 2021, GERD and hiatal hernia, bipolar disorder and chronic anemia, who presented to the emergency room with a [place] of epigastric and right upper quadrant abdominal pain giving her difficulty with deep inspiration without cough or wheezing fever or chills   Elevated LFTs likely secondary to acute hepatitis. etio unclear - acute hepatitis markers--pending -avoid any hepatotoxic medications. -f/u LFT's showing improvement, pt asymptomatic -A GI consultation with Dr Allen Norris appreciated. USG liver--unremarkable -pt would like wants go home and f/u GI as out pt. Dr Dorothey Baseman contact provided. --denies any meds or herbal med. No fever or abd pain --no cp or sob -NO inpatient GI eval recommended  History of renal cell carcinoma status post left nephrectomy. -She has no current active therapy. Follows with Dr Tasia Catchings   Bipolar disorder and history of schizoaffective disorder. -She gets outpatient haloperidol decanoate injections. --not on any po meds  Anemia of chronic disease. - hemoglobin and hematocrit are better than previous values.  Tobacco use. -She will  be counseled for smoking cessation.  DVT prophylaxis. subtenons Lovenox  CONSULTS OBTAINED:  Treatment Team:  [name], MD  DRUG ALLERGIES:   Allergies  Allergen Reactions  . Morphine And Related Shortness Of Breath  . Orange Fruit [Citrus] Hives    DISCHARGE MEDICATIONS:   Allergies as of 11/06/2020      Reactions   Morphine And Related Shortness Of Breath   Orange Fruit [citrus] Hives      Medication List    STOP taking these medications   docusate sodium 100 MG capsule Commonly known as: COLACE   ferrous sulfate 325 (65 FE) MG EC tablet   gabapentin 300 MG capsule Commonly known as: NEURONTIN   gabapentin 800 MG tablet Commonly known as: NEURONTIN   haloperidol decanoate 100 MG/ML injection Commonly known as: HALDOL DECANOATE   naloxone 4 MG/0.1ML Liqd nasal spray kit Commonly known as: NARCAN   Perseris 120 MG Prsy Generic drug: risperiDONE ER   pregabalin 100 MG capsule Commonly known as: LYRICA   risperiDONE 0.5 MG tablet Commonly known as: RISPERDAL   sertraline 100 MG tablet Commonly known as: ZOLOFT     TAKE these medications   oxyCODONE-acetaminophen 5-325 MG tablet Commonly known as: PERCOCET/ROXICET Take 1 tablet by mouth every 4 (four) hours as needed for severe pain.       If you experience worsening of your admission symptoms, develop shortness of breath, life threatening emergency, suicidal or homicidal thoughts you must seek medical attention immediately by calling 911 or calling your MD immediately  if symptoms less severe.  You Must read  complete instructions/literature along with all the possible adverse reactions/side effects for all the Medicines you take and that have been prescribed to you. Take any new Medicines after you have completely understood and accept all the possible adverse reactions/side effects.   Please note  You were cared for by a hospitalist during your hospital stay. If you have any questions about  your discharge medications or the care you received while you were in the hospital after you are discharged, you can call the unit and asked to speak with the hospitalist on call if the hospitalist that took care of you is not available. Once you are discharged, your primary care physician will handle any further medical issues. Please note that NO REFILLS for any discharge medications will be authorized once you are discharged, as it is imperative that you return to your primary care physician (or establish a relationship with a primary care physician if you do not have one) for your aftercare needs so that they can reassess your need for medications and monitor your lab values. Today   SUBJECTIVE   I feel ok and want to go home  VITAL SIGNS:  Blood pressure 105/60, pulse 97, temperature 98.6 F (37 C), temperature source Oral, resp. rate 18, height 5' 7" (1.702 m), weight 113.4 kg, last menstrual period 10/30/2020, SpO2 97 %.  I/O:  No intake or output data in the 24 hours ending 11/06/20 1426  PHYSICAL EXAMINATION:  GENERAL:  45 y.o.-year-old patient lying in the bed with no acute distress. Obese LUNGS: Normal breath sounds bilaterally, no wheezing, rales,rhonchi or crepitation. No use of accessory muscles of respiration.  CARDIOVASCULAR: S1, S2 normal. No murmurs, rubs, or gallops.  ABDOMEN: Soft, non-tender, non-distended. Bowel sounds present. No organomegaly or mass.  EXTREMITIES: No pedal edema, cyanosis, or clubbing.  NEUROLOGIC: Cranial nerves II through XII are intact. Muscle strength 5/5 in all extremities. Sensation intact. Gait not checked.  PSYCHIATRIC: The patient is alert and oriented x 3.  SKIN: No obvious rash, lesion, or ulcer.   DATA REVIEW:   CBC  Recent Labs  Lab 11/06/20 0441  WBC 7.4  HGB 10.9*  HCT 33.8*  PLT 293    Chemistries  Recent Labs  Lab 11/06/20 0441  NA 135  K 4.1  CL 104  CO2 25  GLUCOSE 94  BUN 7  CREATININE 0.87  CALCIUM 8.6*  AST  636*  ALT 693*  ALKPHOS 499*  BILITOT 1.5*    Microbiology Results   No results found for this or any previous visit (from the past 240 hour(s)).  RADIOLOGY:  DG Chest 2 View  Result Date: 11/06/2020 CLINICAL DATA:  Initial evaluation for acute chest pain. EXAM: CHEST - 2 VIEW COMPARISON:  Prior CT from 12/02/2019. FINDINGS: Cardiac and mediastinal silhouettes are stable, and remain within normal limits. Lungs normally inflated. No focal infiltrates. No pulmonary edema or pleural effusion. No pneumothorax. No acute osseous finding. IMPRESSION: No active cardiopulmonary disease. Electronically Signed   By: Jeannine Boga M.D.   On: 11/06/2020 01:35   US ABDOMEN LIMITED RUQ (LIVER/GB)  Result Date: 11/06/2020 CLINICAL DATA:  Right upper quadrant pain, elevated liver function tests, transaminitis EXAM: ULTRASOUND ABDOMEN LIMITED RIGHT UPPER QUADRANT COMPARISON:  10/31/2019 FINDINGS: Gallbladder: Surgically absent Common bile duct: Diameter: 3 mm Liver: No focal lesion identified. Within normal limits in parenchymal echogenicity. Portal vein is patent on color Doppler imaging with normal direction of blood flow towards the liver. Other: None. IMPRESSION: 1. Unremarkable right  upper quadrant ultrasound in a patient status post cholecystectomy. Electronically Signed   By: Randa Ngo M.D.   On: 11/06/2020 03:17     CODE STATUS:     Code Status Orders  (From admission, onward)         Start     Ordered   11/06/20 0438  Full code  Continuous        11/06/20 0439        Code Status History    Date Active Date Inactive Code Status Order ID Comments User Context   02/16/2020 1203 02/18/2020 2148 Full Code [id number]  Billey Co, MD Inpatient   09/29/2015 0923 10/01/2015 1632 Full Code [id number]  Joylene Igo, CNM Inpatient   09/28/2015 1735 09/29/2015 0923 Full Code [id number]  Rubie Maid, MD Inpatient   09/27/2015 0155 09/27/2015 0518 Full Code [id number]  Valda Favia, RN Inpatient   09/07/2015 0307 09/07/2015 0631 Full Code [id number]  Rae Mar, RN Inpatient   07/18/2015 2340 07/19/2015 0335 Full Code [id number]  Norva Karvonen, RN Inpatient   07/06/2015 1029 07/06/2015 1559 Full Code [id number]  Samara Deist, RN Inpatient   Advance Care Planning Activity       TOTAL TIME TAKING CARE OF THIS PATIENT: *35* minutes.    Fritzi Mandes M.D  [facility]    CC: Primary care physician; [facility] [place]

## 2020-11-06 NOTE — Consult Note (Signed)
Zoe Lame, MD [facility]  [address] [place], [place] [address] Phone: [phone] Fax : [phone]  Consultation  Referring Provider:     Dr. Lodema Pilot Primary Care Physician:  [facility] [place] Primary Gastroenterologist: Althia Forts         Reason for Consultation:     Hepatitis  Date of Admission:  11/06/2020 Date of Consultation:  11/06/2020         HPI:   Zoe Ramos is a 45 y.o. female with a history of renal cell carcinoma and nephrectomy.  The patient states that she has had 2 days of shortness of breath but that has resolved.  On admission work-up the patient was noted to have elevated liver enzymes with an AST, ALT, alk phos and bilirubin being elevated.  The patient's labs showed:  Component     Latest Ref Rng & Units 08/27/2020 11/06/2020 11/06/2020          1:06 AM  4:41 AM  AST     15 - 41 U/L 21 671 (H) 636 (H)  ALT     0 - 44 U/L 18 771 (H) 693 (H)  Alkaline Phosphatase     38 - 126 U/L 133 (H) 543 (H) 499 (H)  Total Bilirubin     0.3 - 1.2 mg/dL 0.3 1.5 (H) 1.5 (H)   It appears that the patient's alkaline phosphatase has been chronically elevated and was 158 back in January 2021.  The patient denies ever being told that she had any problems with her liver.  The patient denies any any jaundice or alcohol abuse.  She also denies any dark urine.  Past Medical History:  Diagnosis Date  . Anemia   . Cancer ([place])   . GERD (gastroesophageal reflux disease)   . History of hiatal hernia   . History of trichomonal vaginitis    07/2015  . Mental disorder    bipolar  . Nausea and vomiting during pregnancy     Past Surgical History:  Procedure Laterality Date  . GALLBLADDER SURGERY    . HERNIA REPAIR     hiatal hernia and umbilical  . LAPAROSCOPIC NEPHRECTOMY, HAND ASSISTED Left 02/16/2020   Procedure: HAND ASSISTED LAPAROSCOPIC NEPHRECTOMY;  Surgeon: Billey Co, MD;  Location: [facility] ORS;  Service: Urology;  Laterality: Left;  .  TUBAL LIGATION Bilateral 09/29/2015   Procedure: POST PARTUM TUBAL LIGATION;  Surgeon: Rubie Maid, MD;  Location: [facility] ORS;  Service: Gynecology;  Laterality: Bilateral;  . uterine ablasion      Prior to Admission medications   Medication Sig Start Date End Date Taking? Authorizing Provider  gabapentin (NEURONTIN) 800 MG tablet Take 800 mg by mouth 3 (three) times daily. 10/29/20  Yes [provider]  oxyCODONE-acetaminophen (PERCOCET/ROXICET) 5-325 MG tablet Take 1 tablet by mouth every 4 (four) hours as needed for severe pain.   Yes [provider]  docusate sodium (COLACE) 100 MG capsule Take 1 capsule (100 mg total) by mouth 2 (two) times daily. Patient not taking: No sig reported 02/17/20   Billey Co, MD  ferrous sulfate 325 (65 FE) MG EC tablet Take 1 tablet (325 mg total) by mouth 2 (two) times daily with a meal. Patient not taking: No sig reported 11/21/19   Earlie Server, MD  gabapentin (NEURONTIN) 300 MG capsule Take 300 mg by mouth 3 (three) times daily.  Patient not taking: Reported on 11/06/2020    [provider]  haloperidol  decanoate (HALDOL DECANOATE) 100 MG/ML injection Inject 150 mg into the muscle every 30 (thirty) days.  Patient not taking: No sig reported 01/06/20   [provider]  naloxone [facility]) nasal spray 4 mg/0.1 mL 1 spray into nostril upon signs of opioid overdose. Call 911. May repeat once if no response within 2-3 minutes. Patient not taking: No sig reported 11/28/19   Borders, Kirt Boys, NP  pregabalin (LYRICA) 100 MG capsule Take 100 mg by mouth 2 (two) times daily. Patient not taking: No sig reported    [provider]  risperiDONE (RISPERDAL) 0.5 MG tablet Take 0.5 mg by mouth at bedtime.  Patient not taking: No sig reported    [provider]  risperiDONE ER (PERSERIS) 120 MG PRSY Perseris 120 mg abdominal subcutaneous ext. release suspension syringe  INJECT ONE SYRIGE ONCE Q MONTH UTD Patient not  taking: No sig reported    [provider]  sertraline (ZOLOFT) 100 MG tablet Take 150 mg by mouth every morning.  Patient not taking: No sig reported    [provider]    Family History  Problem Relation Age of Onset  . Cancer Mother        ovarian, breast  . Breast cancer Mother   . Diabetes Father   . Prostate cancer Father   . Diabetes Sister   . Kidney cancer Maternal Grandmother   . Pancreatic cancer Paternal Grandfather      Social History   Tobacco Use  . Smoking status: Current Every Day Smoker    Packs/day: 1.00    Years: 20.00    Pack years: 20.00    Types: Cigarettes  . Smokeless tobacco: Never Used  Vaping Use  . Vaping Use: Never used  Substance Use Topics  . Alcohol use: No  . Drug use: No    Allergies as of 11/06/2020 - Review Complete 11/06/2020  Allergen Reaction Noted  . Morphine and related Shortness Of Breath 03/06/2015  . Orange fruit [citrus] Hives 07/06/2015    Review of Systems:    All systems reviewed and negative except where noted in HPI.   Physical Exam:  Vital signs in last 24 hours: Temp:  [98.6 F (37 C)] 98.6 F (37 C) (01/04 0103) Pulse Rate:  [88-107] 100 (01/04 0926) Resp:  [11-26] 21 (01/04 0926) BP: (99-123)/(61-80) 99/66 (01/04 0926) SpO2:  [94 %-99 %] 95 % (01/04 0926) Weight:  [113.4 kg] 113.4 kg (01/04 0103)   General:   Pleasant, cooperative in NAD Head:  Normocephalic and atraumatic. Eyes:   No icterus.   Conjunctiva pink. PERRLA. Ears:  Normal auditory acuity. Neck:  Supple; no masses or thyroidomegaly Lungs: Respirations even and unlabored. Lungs clear to auscultation bilaterally.   No wheezes, crackles, or rhonchi.  Heart:  Regular rate and rhythm;  Without murmur, clicks, rubs or gallops Abdomen:  Soft, nondistended, nontender. Normal bowel sounds. No appreciable masses or hepatomegaly.  No rebound or guarding.  Multiple abdominal scars. Rectal:  Not performed. Msk:  Symmetrical without  gross deformities.    Extremities:  Without edema, cyanosis or clubbing. Neurologic:  Alert and oriented x3;  grossly normal neurologically. Skin:  Intact without significant lesions or rashes. Cervical Nodes:  No significant cervical adenopathy. Psych:  Alert and cooperative. Normal affect.  LAB RESULTS: Recent Labs    11/06/20 0106 11/06/20 0441  WBC 6.5 7.4  HGB 11.5* 10.9*  HCT 36.8 33.8*  PLT 318 293   BMET Recent Labs  11/06/20 0106 11/06/20 0441  NA 135 135  K 4.1 4.1  CL 102 104  CO2 25 25  GLUCOSE 100* 94  BUN 8 7  CREATININE 0.84 0.87  CALCIUM 8.8* 8.6*   LFT Recent Labs    11/06/20 0441  PROT 7.2  ALBUMIN 3.0*  AST 636*  ALT 693*  ALKPHOS 499*  BILITOT 1.5*   PT/INR Recent Labs    11/06/20 0441  LABPROT 12.7  INR 1.0    STUDIES: DG Chest 2 View  Result Date: 11/06/2020 CLINICAL DATA:  Initial evaluation for acute chest pain. EXAM: CHEST - 2 VIEW COMPARISON:  Prior CT from 12/02/2019. FINDINGS: Cardiac and mediastinal silhouettes are stable, and remain within normal limits. Lungs normally inflated. No focal infiltrates. No pulmonary edema or pleural effusion. No pneumothorax. No acute osseous finding. IMPRESSION: No active cardiopulmonary disease. Electronically Signed   By: Jeannine Boga M.D.   On: 11/06/2020 01:35   US ABDOMEN LIMITED RUQ (LIVER/GB)  Result Date: 11/06/2020 CLINICAL DATA:  Right upper quadrant pain, elevated liver function tests, transaminitis EXAM: ULTRASOUND ABDOMEN LIMITED RIGHT UPPER QUADRANT COMPARISON:  10/31/2019 FINDINGS: Gallbladder: Surgically absent Common bile duct: Diameter: 3 mm Liver: No focal lesion identified. Within normal limits in parenchymal echogenicity. Portal vein is patent on color Doppler imaging with normal direction of blood flow towards the liver. Other: None. IMPRESSION: 1. Unremarkable right upper quadrant ultrasound in a patient status post cholecystectomy. Electronically Signed   By: Randa Ngo M.D.   On: 11/06/2020 03:17      Impression / Plan:   Assessment: Active Problems:   Acute hepatitis   Zoe Ramos is a 45 y.o. y/o female with acute hepatitis with improving and liver enzymes.  The patient's liver enzymes have dropped by approximately 10% across the board since admission.  The patient denies any ingestion of liver toxic substances and her Covid test appears to still be pending.  The patient does have chronic anemia and is followed by hematology/oncology for her history of renal cell carcinoma.  Plan:  The patient's acute hepatitis panel is pending and the patient's liver enzymes are trending down.  I do not recommend any further GI work-up at this time and what ever the acute insult was to her liver it appears to be improving.  With the patient's history of elevated alkaline phosphatase further follow-up should be done as an outpatient.  The patient states that her shortness of breath has improved and she is eager to go home.  I have informed her that I do not make that decision and that while she is still in the hospital I will continue to monitor her liver enzymes.  The patient has been explained the plan agrees with it.  Thank you for involving me in the care of this patient.      LOS: 0 days   Zoe Lame, MD, [facility] 11/06/2020, 11:38 AM,  Pager [phone] 7am-5pm  Check AMION for 5pm -7am coverage and on weekends   Note: This dictation was prepared with Dragon dictation along with smaller phrase technology. Any transcriptional errors that result from this process are unintentional.

## 2020-11-06 NOTE — Telephone Encounter (Signed)
Dr. Cathie Hoops reviewed chart and abnormal values are related to liver. Pt is currently in ER. Per MD, pt will most likely be seen by GI and they will consult Dr. Cathie Hoops if needed. Called pt to notify her but unable to reach her, Detailed VM left. Advised to call back if there are any other questions.

## 2020-11-06 NOTE — H&P (Signed)
[place]   PATIENT NAME: Zoe Ramos    MR#:  [id number]  DATE OF BIRTH:  12-12-1975  DATE OF ADMISSION:  11/06/2020  PRIMARY CARE PHYSICIAN: [facility] [place]   REQUESTING/REFERRING PHYSICIAN: Hinda Kehr, MD  CHIEF COMPLAINT:   Chief Complaint  Patient presents with  . Shortness of Breath    HISTORY OF PRESENT ILLNESS:  Zoe Ramos  is a 45 y.o. African-American female with a known history of renal cell carcinoma status post left nephrectomy in June 2021, GERD and hiatal hernia, bipolar disorder and chronic anemia, who presented to the emergency room with a [place] of epigastric and right upper quadrant abdominal pain giving her difficulty with deep inspiration without cough or wheezing fever or chills.  She denies any nausea or vomiting or diarrhea.  No chest pain or palpitations.  Pain has been radiating to the back and was described as sharp pain.  No dysuria, oliguria or hematuria or flank pain.  No rhinorrhea or nasal congestion or sore throat or earache.  She had 1 dose of Pfizer Covid vaccine on 06/11/2020.  Upon presentation to the ER heart rate was 105 with otherwise normal vital signs.  Later on heart rate was 91.  Labs revealed elevated alk phos of 543, AST of 671 and ALT 771 with total bili 1.5 and total protein 7.6 with low albumin of 3.2.  High-sensitivity troponin I was done in the wrist 9.  CBC showed hemoglobin of 11.5 up from 10.5 in October of last year and hematocrit 36.8 compared to 34.5.  COVID-19 PCR is currently pending.  Two-view chest x-ray showed no acute cardiopulmonary disease.  Right upper ultrasound showed cholecystectomy status with no acute abnormalities.EKG showed sinus tachycardia with rate of 103 with suspected Q waves in V1 and V3 and lead III.  Dr. Vicente Males was contacted about the patient and is aware.  The patient will be admitted to a medical bed for further evaluation and management. PAST MEDICAL HISTORY:   Past Medical  History:  Diagnosis Date  . Anemia   . Cancer ([place])   . GERD (gastroesophageal reflux disease)   . History of hiatal hernia   . History of trichomonal vaginitis    07/2015  . Mental disorder    bipolar  . Nausea and vomiting during pregnancy     PAST SURGICAL HISTORY:   Past Surgical History:  Procedure Laterality Date  . GALLBLADDER SURGERY    . HERNIA REPAIR     hiatal hernia and umbilical  . LAPAROSCOPIC NEPHRECTOMY, HAND ASSISTED Left 02/16/2020   Procedure: HAND ASSISTED LAPAROSCOPIC NEPHRECTOMY;  Surgeon: Billey Co, MD;  Location: [facility] ORS;  Service: Urology;  Laterality: Left;  . TUBAL LIGATION Bilateral 09/29/2015   Procedure: POST PARTUM TUBAL LIGATION;  Surgeon: Rubie Maid, MD;  Location: [facility] ORS;  Service: Gynecology;  Laterality: Bilateral;  . uterine ablasion      SOCIAL HISTORY:   Social History   Tobacco Use  . Smoking status: Current Every Day Smoker    Packs/day: 1.00    Years: 20.00    Pack years: 20.00    Types: Cigarettes  . Smokeless tobacco: Never Used  Substance Use Topics  . Alcohol use: No    FAMILY HISTORY:   Family History  Problem Relation Age of Onset  . Cancer Mother        ovarian, breast  . Breast cancer Mother   . Diabetes Father   . Prostate  cancer Father   . Diabetes Sister   . Kidney cancer Maternal Grandmother   . Pancreatic cancer Paternal Grandfather     DRUG ALLERGIES:   Allergies  Allergen Reactions  . Morphine And Related Shortness Of Breath  . Orange Fruit [Citrus] Hives    REVIEW OF SYSTEMS:   ROS As per history of present illness. All pertinent systems were reviewed above. Constitutional, HEENT, cardiovascular, respiratory, GI, GU, musculoskeletal, neuro, psychiatric, endocrine, integumentary and hematologic systems were reviewed and are otherwise negative/unremarkable except for positive findings mentioned above in the HPI.   MEDICATIONS AT HOME:   Prior to Admission medications   Medication  Sig Start Date End Date Taking? Authorizing Provider  docusate sodium (COLACE) 100 MG capsule Take 1 capsule (100 mg total) by mouth 2 (two) times daily. Patient not taking: Reported on 06/13/2020 02/17/20   Billey Co, MD  ferrous sulfate 325 (65 FE) MG EC tablet Take 1 tablet (325 mg total) by mouth 2 (two) times daily with a meal. Patient not taking: Reported on 03/06/2020 11/21/19   Earlie Server, MD  gabapentin (NEURONTIN) 300 MG capsule Take 300 mg by mouth 3 (three) times daily.     [provider]  haloperidol decanoate (HALDOL DECANOATE) 100 MG/ML injection Inject 150 mg into the muscle every 30 (thirty) days.  Patient not taking: Reported on 06/13/2020 01/06/20   [provider]  naloxone [facility]) nasal spray 4 mg/0.1 mL 1 spray into nostril upon signs of opioid overdose. Call 911. May repeat once if no response within 2-3 minutes. Patient not taking: Reported on 03/06/2020 11/28/19   Borders, Kirt Boys, NP  oxyCODONE-acetaminophen (PERCOCET/ROXICET) 5-325 MG tablet Take 1 tablet by mouth every 4 (four) hours as needed for severe pain.    [provider]  pregabalin (LYRICA) 100 MG capsule Take 100 mg by mouth 2 (two) times daily. Patient not taking: Reported on 08/29/2020    [provider]  risperiDONE (RISPERDAL) 0.5 MG tablet Take 0.5 mg by mouth at bedtime.  Patient not taking: Reported on 06/13/2020    [provider]  risperiDONE ER (PERSERIS) 120 MG PRSY Perseris 120 mg abdominal subcutaneous ext. release suspension syringe  INJECT ONE SYRIGE ONCE Q MONTH UTD Patient not taking: Reported on 06/13/2020    [provider]  sertraline (ZOLOFT) 100 MG tablet Take 150 mg by mouth every morning.  Patient not taking: Reported on 06/13/2020    [provider]      VITAL SIGNS:  Blood pressure 105/64, pulse 98, temperature 98.6 F (37 C), temperature source Oral, resp. rate (!) 24, height _0  (1.702 m), weight 113.4 kg, last  menstrual period 10/30/2020, SpO2 95 %.  PHYSICAL EXAMINATION:  Physical Exam  GENERAL:  45 y.o.-year-old African-American female patient lying in the bed with no acute distress.  EYES: Pupils equal, round, reactive to light and accommodation. No scleral icterus. Extraocular muscles intact.  HEENT: Head atraumatic, normocephalic. Oropharynx and nasopharynx clear.  NECK:  Supple, no jugular venous distention. No thyroid enlargement, no tenderness.  LUNGS: Normal breath sounds bilaterally, no wheezing, rales,rhonchi or crepitation. No use of accessory muscles of respiration.  CARDIOVASCULAR: Regular rate and rhythm, S1, S2 normal. No murmurs, rubs, or gallops.  ABDOMEN: Soft, nondistended with epigastric and right upper quadrant tenderness without rebound tenderness guarding or rigidity.. Bowel sounds present. No organomegaly or mass.  EXTREMITIES: No pedal edema, cyanosis, or clubbing.  NEUROLOGIC: Cranial nerves II through XII are intact. Muscle strength 5/5  in all extremities. Sensation intact. Gait not checked.  PSYCHIATRIC: The patient is alert and oriented x 3.  Normal affect and good eye contact. SKIN: No obvious rash, lesion, or ulcer.   LABORATORY PANEL:   CBC Recent Labs  Lab 11/06/20 0106  WBC 6.5  HGB 11.5*  HCT 36.8  PLT 318   ------------------------------------------------------------------------------------------------------------------  Chemistries  Recent Labs  Lab 11/06/20 0106  NA 135  K 4.1  CL 102  CO2 25  GLUCOSE 100*  BUN 8  CREATININE 0.84  CALCIUM 8.8*  AST 671*  ALT 771*  ALKPHOS 543*  BILITOT 1.5*   ------------------------------------------------------------------------------------------------------------------  Cardiac Enzymes No results for input(s): TROPONINI in the last 168 hours. ------------------------------------------------------------------------------------------------------------------  RADIOLOGY:  DG Chest 2 View  Result  Date: 11/06/2020 CLINICAL DATA:  Initial evaluation for acute chest pain. EXAM: CHEST - 2 VIEW COMPARISON:  Prior CT from 12/02/2019. FINDINGS: Cardiac and mediastinal silhouettes are stable, and remain within normal limits. Lungs normally inflated. No focal infiltrates. No pulmonary edema or pleural effusion. No pneumothorax. No acute osseous finding. IMPRESSION: No active cardiopulmonary disease. Electronically Signed   By: Jeannine Boga M.D.   On: 11/06/2020 01:35   US ABDOMEN LIMITED RUQ (LIVER/GB)  Result Date: 11/06/2020 CLINICAL DATA:  Right upper quadrant pain, elevated liver function tests, transaminitis EXAM: ULTRASOUND ABDOMEN LIMITED RIGHT UPPER QUADRANT COMPARISON:  10/31/2019 FINDINGS: Gallbladder: Surgically absent Common bile duct: Diameter: 3 mm Liver: No focal lesion identified. Within normal limits in parenchymal echogenicity. Portal vein is patent on color Doppler imaging with normal direction of blood flow towards the liver. Other: None. IMPRESSION: 1. Unremarkable right upper quadrant ultrasound in a patient status post cholecystectomy. Electronically Signed   By: Randa Ngo M.D.   On: 11/06/2020 03:17      IMPRESSION AND PLAN:   1.  Elevated LFTs likely secondary to acute hepatitis. -The patient will be admitted to medical monitored bed. -We will obtain viral hepatitis markers. -We will avoid any hepatotoxic medications. -We will follow LFTs with hydration. -A GI consultation will be obtained. -Dr. Vicente Males was notified and is aware about the patient.  2.  History of renal cell carcinoma status post left nephrectomy. -She has no current active therapy.  3.  Bipolar disorder and history of schizoaffective disorder. -She gets outpatient haloperidol decanoate injections.  4.  Anemia of chronic disease. -Her hemoglobin and hematocrit are better than previous values.  5.  Tobacco use. -She will be counseled for smoking cessation.  6.  DVT prophylaxis. The  subtenons Lovenox    All the records are reviewed and case discussed with ED provider. The plan of care was discussed in details with the patient (and family). I answered all questions. The patient agreed to proceed with the above mentioned plan. Further management will depend upon hospital course.   CODE STATUS: Full code  Status is: Inpatient  Remains inpatient appropriate because:Ongoing active pain requiring inpatient pain management, Ongoing diagnostic testing needed not appropriate for outpatient work up, Unsafe d/c plan, IV treatments appropriate due to intensity of illness or inability to take PO and Inpatient level of care appropriate due to severity of illness   Dispo: The patient is from: Home              Anticipated d/c is to: Home              Anticipated d/c date is: 2 days  Patient currently is not medically stable to d/c.    TOTAL TIME TAKING CARE OF THIS PATIENT: 55 minutes.    Christel Mormon M.D on 11/06/2020 at 4:08 AM  [facility]   From 7 PM-7 AM, contact night-coverage [email or web address]  CC: Primary care physician; [facility] [place]

## 2020-11-06 NOTE — ED Provider Notes (Signed)
[facility] Emergency Department Provider Note  ____________________________________________   Event Date/Time   First MD Initiated Contact with Patient 11/06/20 0130     (approximate)  I have reviewed the triage vital signs and the nursing notes.   HISTORY  Chief Complaint Shortness of Breath    HPI Zoe Ramos is a 45 y.o. female with medical history as listed below which notably includes a history of renal cell carcinoma status post left nephrectomy as well as a prior cholecystectomy.  She is not undergoing chemotherapy.  She presents for evaluation of acute onset pain and the upper right part of her abdomen that radiates through to her back.  It is sharp and worse when she takes deep breaths or when she pushes on her upper abdomen.  It is anywhere from mild to severe in intensity but nothing in particular makes it worse except for taking deep breaths, including no effect when she eats.  She has had no nausea or vomiting.  No lower abdominal pain.  She denies difficulty breathing and chest pain, just has the pain in her upper abdomen with deep inspiration.  She also denies fever, sore throat,  and dysuria.  She has not had these symptoms in the past and says that her cholecystectomy was many years ago and that the pain at the time was much worse.  She states that she went to her pain clinic a couple of days ago and they did lab work and told her that "my kidney and liver are failing" but she does not have any additional information.        Past Medical History:  Diagnosis Date  . Anemia   . Cancer ([place])   . GERD (gastroesophageal reflux disease)   . History of hiatal hernia   . History of trichomonal vaginitis    07/2015  . Mental disorder    bipolar  . Nausea and vomiting during pregnancy     Patient Active Problem List   Diagnosis Date Noted  . Acute hepatitis 11/06/2020  . Clear cell renal cell carcinoma, left ([place]) 03/06/2020  . Axillary  lymphadenopathy 03/06/2020  . Goals of care, counseling/discussion 03/06/2020  . Left renal mass 02/16/2020  . Closed fracture of medial malleolus 10/07/2018  . Preterm delivery 09/29/2015  . Preterm premature rupture of membranes (PPROM) with onset of labor within 24 hours of rupture in third trimester, antepartum 09/28/2015  . Pregnancy 09/27/2015  . Depressed bipolar affective disorder ([place]) 08/09/2015  . Schizo-affective schizophrenia, in remission ([place]) 08/09/2015  . Labor and delivery, indication for care 07/18/2015  . Pelvic pressure in female 07/06/2015  . AMA (advanced maternal age) multigravida 35+ 05/17/2015  . H/O preterm delivery, currently pregnant 05/17/2015  . S/P Nissen fundoplication (with gastrostomy tube placement) ([place]) 11/15/2014  . Iron deficiency anemia 09/01/2014  . Hiatal hernia 09/01/2014  . Headache 09/01/2014  . GERD (gastroesophageal reflux disease) 09/01/2014  . Chest pain, atypical 09/01/2014  . Bipolar disorder (manic depression) ([place]) 09/01/2014  . Asthma 09/01/2014  . Dyspepsia 03/30/2014  . Anemia 03/30/2014    Past Surgical History:  Procedure Laterality Date  . GALLBLADDER SURGERY    . HERNIA REPAIR     hiatal hernia and umbilical  . LAPAROSCOPIC NEPHRECTOMY, HAND ASSISTED Left 02/16/2020   Procedure: HAND ASSISTED LAPAROSCOPIC NEPHRECTOMY;  Surgeon: Billey Co, MD;  Location: [facility] ORS;  Service: Urology;  Laterality: Left;  . TUBAL LIGATION Bilateral 09/29/2015   Procedure: POST PARTUM TUBAL LIGATION;  Surgeon: Rubie Maid, MD;  Location: [facility] ORS;  Service: Gynecology;  Laterality: Bilateral;  . uterine ablasion      Prior to Admission medications   Medication Sig Start Date End Date Taking? Authorizing Provider  docusate sodium (COLACE) 100 MG capsule Take 1 capsule (100 mg total) by mouth 2 (two) times daily. Patient not taking: Reported on 06/13/2020 02/17/20   Billey Co, MD  ferrous sulfate 325 (65 FE) MG EC tablet Take 1  tablet (325 mg total) by mouth 2 (two) times daily with a meal. Patient not taking: Reported on 03/06/2020 11/21/19   Earlie Server, MD  gabapentin (NEURONTIN) 300 MG capsule Take 300 mg by mouth 3 (three) times daily.     [provider]  haloperidol decanoate (HALDOL DECANOATE) 100 MG/ML injection Inject 150 mg into the muscle every 30 (thirty) days.  Patient not taking: Reported on 06/13/2020 01/06/20   [provider]  naloxone [facility]) nasal spray 4 mg/0.1 mL 1 spray into nostril upon signs of opioid overdose. Call 911. May repeat once if no response within 2-3 minutes. Patient not taking: Reported on 03/06/2020 11/28/19   Borders, Kirt Boys, NP  oxyCODONE-acetaminophen (PERCOCET/ROXICET) 5-325 MG tablet Take 1 tablet by mouth every 4 (four) hours as needed for severe pain.    [provider]  pregabalin (LYRICA) 100 MG capsule Take 100 mg by mouth 2 (two) times daily. Patient not taking: Reported on 08/29/2020    [provider]  risperiDONE (RISPERDAL) 0.5 MG tablet Take 0.5 mg by mouth at bedtime.  Patient not taking: Reported on 06/13/2020    [provider]  risperiDONE ER (PERSERIS) 120 MG PRSY Perseris 120 mg abdominal subcutaneous ext. release suspension syringe  INJECT ONE SYRIGE ONCE Q MONTH UTD Patient not taking: Reported on 06/13/2020    [provider]  sertraline (ZOLOFT) 100 MG tablet Take 150 mg by mouth every morning.  Patient not taking: Reported on 06/13/2020    [provider]    Allergies Morphine and related and Orange fruit [citrus]  Family History  Problem Relation Age of Onset  . Cancer Mother        ovarian, breast  . Breast cancer Mother   . Diabetes Father   . Prostate cancer Father   . Diabetes Sister   . Kidney cancer Maternal Grandmother   . Pancreatic cancer Paternal Grandfather     Social History Social History   Tobacco Use  . Smoking status: Current Every Day Smoker    Packs/day: 1.00     Years: 20.00    Pack years: 20.00    Types: Cigarettes  . Smokeless tobacco: Never Used  Vaping Use  . Vaping Use: Never used  Substance Use Topics  . Alcohol use: No  . Drug use: No    Review of Systems Constitutional: No fever/chills Eyes: No visual changes. ENT: No sore throat. Cardiovascular: Denies chest pain. Respiratory: Denies shortness of breath.  Pain with deep inspiration but the pain is in her upper abdomen. Gastrointestinal: Upper and right upper quadrant abdominal pain with no nausea or vomiting.  No lower abdominal pain. Genitourinary: Negative for dysuria. Musculoskeletal: Negative for neck pain.  Negative for back pain. Integumentary: Negative for rash. Neurological: Negative for headaches, focal weakness or numbness.   ____________________________________________   PHYSICAL EXAM:  VITAL SIGNS: ED Triage Vitals  Enc Vitals Group     BP 11/06/20 0104 123/80     Pulse Rate 11/06/20 0103 (!) 105  Resp 11/06/20 0103 18     Temp 11/06/20 0103 98.6 F (37 C)     Temp Source 11/06/20 0103 Oral     SpO2 11/06/20 0103 97 %     Weight 11/06/20 0103 113.4 kg (250 lb)     Height 11/06/20 0103 1.702 m (5\' 7" )     Head Circumference --      Peak Flow --      Pain Score 11/06/20 0103 10     Pain Loc --      Pain Edu? --      Excl. in GC? --     Constitutional: Alert and oriented.  Eyes: Conjunctivae are normal.  Head: Atraumatic. Nose: No congestion/rhinnorhea. Mouth/Throat: Patient is wearing a mask. Neck: No stridor.  No meningeal signs.   Cardiovascular: Normal rate, regular rhythm. Good peripheral circulation. Respiratory: Normal respiratory effort.  No retractions. Gastrointestinal: Obese.  Soft and nondistended.  Tender to palpation in the epigastrium and right upper quadrant with positive Murphy sign.  No lower abdominal tenderness, no localized peritonitis. Musculoskeletal: No lower extremity tenderness nor edema. No gross deformities of  extremities. Neurologic:  Normal speech and language. No gross focal neurologic deficits are appreciated.  Skin:  Skin is warm, dry and intact. Psychiatric: Mood and affect are normal. Speech and behavior are normal.  ____________________________________________   LABS (all labs ordered are listed, but only abnormal results are displayed)  Labs Reviewed  CBC - Abnormal; Notable for the following components:      Result Value   RBC 5.91 (*)    Hemoglobin 11.5 (*)    MCV 62.3 (*)    MCH 19.5 (*)    RDW 19.0 (*)    All other components within normal limits  COMPREHENSIVE METABOLIC PANEL - Abnormal; Notable for the following components:   Glucose, Bld 100 (*)    Calcium 8.8 (*)    Albumin 3.2 (*)    AST 671 (*)    ALT 771 (*)    Alkaline Phosphatase 543 (*)    Total Bilirubin 1.5 (*)    All other components within normal limits  LIPASE, BLOOD - Abnormal; Notable for the following components:   Lipase 52 (*)    All other components within normal limits  SARS CORONAVIRUS 2 (TAT 6-24 HRS)  HEPATITIS PANEL, ACUTE  TROPONIN I (HIGH SENSITIVITY)  TROPONIN I (HIGH SENSITIVITY)   ____________________________________________  EKG  ED ECG REPORT I, 01/04/21, the attending physician, personally viewed and interpreted this ECG.  Date: 11/06/2020 EKG Time: 1 AM Rate: 103 Rhythm: Borderline sinus tachycardia QRS Axis: normal Intervals: normal ST/T Wave abnormalities: Non-specific ST segment / T-wave changes, but no clear evidence of acute ischemia. Narrative Interpretation: no definitive evidence of acute ischemia; does not meet STEMI criteria.   ____________________________________________  RADIOLOGY I, 01/04/2021, personally viewed and evaluated these images (plain radiographs) as part of my medical decision making, as well as reviewing the written report by the radiologist.  ED MD interpretation: No acute abnormality identified on chest x-ray. No acute abnormalities  on U/S.  Official radiology report(s): DG Chest 2 View  Result Date: 11/06/2020 CLINICAL DATA:  Initial evaluation for acute chest pain. EXAM: CHEST - 2 VIEW COMPARISON:  Prior CT from 12/02/2019. FINDINGS: Cardiac and mediastinal silhouettes are stable, and remain within normal limits. Lungs normally inflated. No focal infiltrates. No pulmonary edema or pleural effusion. No pneumothorax. No acute osseous finding. IMPRESSION: No active cardiopulmonary disease. Electronically Signed   By:  Jeannine Boga M.D.   On: 11/06/2020 01:35   US ABDOMEN LIMITED RUQ (LIVER/GB)  Result Date: 11/06/2020 CLINICAL DATA:  Right upper quadrant pain, elevated liver function tests, transaminitis EXAM: ULTRASOUND ABDOMEN LIMITED RIGHT UPPER QUADRANT COMPARISON:  10/31/2019 FINDINGS: Gallbladder: Surgically absent Common bile duct: Diameter: 3 mm Liver: No focal lesion identified. Within normal limits in parenchymal echogenicity. Portal vein is patent on color Doppler imaging with normal direction of blood flow towards the liver. Other: None. IMPRESSION: 1. Unremarkable right upper quadrant ultrasound in a patient status post cholecystectomy. Electronically Signed   By: Randa Ngo M.D.   On: 11/06/2020 03:17    ____________________________________________   PROCEDURES   Procedure(s) performed (including Critical Care):  Procedures   ____________________________________________   INITIAL IMPRESSION / MDM / [place] / ED COURSE  As part of my medical decision making, I reviewed the following data within the [place] notes reviewed and incorporated, Labs reviewed , EKG interpreted , Old chart reviewed, Discussed with admitting physician (Dr. Sidney Ace), Discussed with gastroenterologist (Dr. Vicente Males) and reviewed Notes from prior ED visits   Differential diagnosis includes, but is not limited to, acute hepatitis, biliary obstruction in spite of prior cholecystectomy, a  new neoplastic process or sequela of known renal cell carcinoma, PE, pneumonia, less likely acute infection.  Patient is not having acute dyspnea, just pain with deep inspiration.  I personally reviewed the patient's imaging and agree with the radiologist's interpretation that there is no acute abnormality on chest x-ray.  She has no leukocytosis and a normal hemoglobin as well as a high-sensitivity troponin of 10.  Her comprehensive metabolic panel is notable for normal creatinine but elevated LFTs including AST of 671, ALT of 771, and alkaline phosphatase of 543.  T bili, however, is 1.5.  Normal anion gap.  I added on a lipase.  I ordered a right upper quadrant ultrasound even though the patient is status post cholecystectomy to evaluate for any acute abnormalities including the possibility of a portal vein thrombosis.  Initially she was mildly tachycardic but her pulse rate is now in the 90s which is reassuring.  I think that pulmonary embolism is unlikely and given the probability of needing advanced imaging, I am reluctant to order a scan that requires IV contrast given that she only has 1 kidney and she may need more precise imaging, such as an MRCP, that would require different kind of contrast.  The patient says she does not need any pain medicine right now.  I will reassess after the ultrasound results are back.  She has no signs or symptoms of COVID and she has received 1 COVID vaccination.  Given the shortage of test kits, I will hold off on COVID-19 testing until I am certain that she will be staying in the hospital.      Clinical Course as of 11/06/20 0422  Tue Nov 06, 2020  0205 Lipase(!): 52 [CF]  0220 Of note, I ordered an acute hepatitis panel as well. [CF]  0353 Unremarkable ultrasound.  I discussed the case by phone with Dr. Vicente Males with gastroenterology.  He agreed with the plan for admission and someone will see her in the morning and decide what kind of advanced imaging to get but  he agreed that we should not order anything else tonight to preserve her renal function.  I consulted the hospitalist for admission. [CF]  0422 Discussed case by phone with Dr. Sidney Ace.  Also ordered 524mL NS  IV bolus. [CF]    Clinical Course User Index [CF] Hinda Kehr, MD     ____________________________________________  FINAL CLINICAL IMPRESSION(S) / ED DIAGNOSES  Final diagnoses:  Transaminitis  Upper abdominal pain     MEDICATIONS GIVEN DURING THIS VISIT:  Medications  sodium chloride 0.9 % bolus 500 mL (has no administration in time range)     ED Discharge Orders    None      *Please note:  Zoe Ramos was evaluated in Emergency Department on 11/06/2020 for the symptoms described in the history of present illness. She was evaluated in the context of the global COVID-19 pandemic, which necessitated consideration that the patient might be at risk for infection with the SARS-CoV-2 virus that causes COVID-19. Institutional protocols and algorithms that pertain to the evaluation of patients at risk for COVID-19 are in a state of rapid change based on information released by regulatory bodies including the CDC and federal and state organizations. These policies and algorithms were followed during the patient's care in the ED.  Some ED evaluations and interventions may be delayed as a result of limited staffing during and after the pandemic.*  Note:  This document was prepared using Dragon voice recognition software and may include unintentional dictation errors.   Hinda Kehr, MD 11/06/20 [phone]

## 2020-11-06 NOTE — Discharge Instructions (Signed)
Pt advised to f/u Dr Maryan Puls will call and make appt

## 2020-11-06 NOTE — ED Triage Notes (Signed)
Pt in with co shob x 2 days co back pain states hurts to breathe. Denies any injury, denies any cold symptoms.

## 2020-11-06 NOTE — ED Notes (Signed)
GI at bedside

## 2020-11-06 NOTE — ED Notes (Signed)
Pt ambulatory with stead gait to bathroom.

## 2020-11-13 ENCOUNTER — Telehealth: Payer: Self-pay | Admitting: Oncology

## 2020-11-13 NOTE — Telephone Encounter (Signed)
Please call pt and see if she would like to r/s appts.

## 2020-11-13 NOTE — Telephone Encounter (Signed)
Done  Pt called [facility] to cx her 11/14/20 lab and her 11/16/20 MD/+/- Venofer appts.  Pt stated that she would contact the office back at a later date to have all appts R/S.Marland Kitchen CT was also cx per pt request.

## 2020-11-13 NOTE — Telephone Encounter (Signed)
Pt called to cancel appointments for 11/14/20 and 11/16/20, Pt can be reached at [phone].

## 2020-11-14 ENCOUNTER — Ambulatory Visit: Payer: Medicaid Other

## 2020-11-14 ENCOUNTER — Inpatient Hospital Stay: Payer: Medicaid Other

## 2020-11-16 ENCOUNTER — Emergency Department
Admission: EM | Admit: 2020-11-16 | Discharge: 2020-11-16 | Discharge status: Against Medical Advice | Payer: Medicaid Other

## 2020-11-16 ENCOUNTER — Inpatient Hospital Stay: Payer: Medicaid Other | Admitting: Oncology

## 2020-11-16 ENCOUNTER — Ambulatory Visit: Payer: Medicaid Other

## 2020-11-16 ENCOUNTER — Telehealth: Payer: Self-pay | Admitting: *Deleted

## 2020-11-16 ENCOUNTER — Other Ambulatory Visit: Payer: Self-pay

## 2020-11-16 DIAGNOSIS — C642 Malignant neoplasm of left kidney, except renal pelvis: Secondary | ICD-10-CM

## 2020-11-16 NOTE — Telephone Encounter (Signed)
Please schedule follow up appt next week. Lab md cbc cmp ldh

## 2020-11-16 NOTE — Telephone Encounter (Signed)
Done..  Lab/MD f/u appt has been sched Pt was made aware

## 2020-11-16 NOTE — Telephone Encounter (Signed)
Patient called reporting that she is voiding orange colored urine for the past week and is asking for a new doctor stating that "things just aren't working out". Please advise

## 2020-11-18 DIAGNOSIS — C649 Malignant neoplasm of unspecified kidney, except renal pelvis: Secondary | ICD-10-CM | POA: Insufficient documentation

## 2020-11-19 DIAGNOSIS — B169 Acute hepatitis B without delta-agent and without hepatic coma: Secondary | ICD-10-CM | POA: Insufficient documentation

## 2020-11-23 ENCOUNTER — Inpatient Hospital Stay: Payer: Medicaid Other | Attending: Oncology

## 2020-11-23 ENCOUNTER — Inpatient Hospital Stay: Payer: Medicaid Other | Admitting: Oncology

## 2020-11-23 ENCOUNTER — Telehealth: Payer: Self-pay | Admitting: Oncology

## 2020-11-23 NOTE — Telephone Encounter (Signed)
11/23/2020 Called pt about missed appt today. Offered to r/s, but pt declined. She says she will call back to r/s at a later date. SRW

## 2021-02-28 ENCOUNTER — Other Ambulatory Visit: Payer: Self-pay | Admitting: Oncology

## 2021-02-28 DIAGNOSIS — R590 Localized enlarged lymph nodes: Secondary | ICD-10-CM

## 2021-04-08 ENCOUNTER — Ambulatory Visit: Payer: Medicaid Other | Admitting: Internal Medicine

## 2021-04-08 ENCOUNTER — Other Ambulatory Visit: Payer: Self-pay

## 2021-04-08 ENCOUNTER — Encounter: Payer: Self-pay | Admitting: Internal Medicine

## 2021-04-08 VITALS — BP 104/74 | HR 80 | Ht 66.5 in | Wt 258.0 lb

## 2021-04-08 DIAGNOSIS — R945 Abnormal results of liver function studies: Secondary | ICD-10-CM | POA: Diagnosis not present

## 2021-04-08 DIAGNOSIS — R768 Other specified abnormal immunological findings in serum: Secondary | ICD-10-CM | POA: Diagnosis not present

## 2021-04-08 DIAGNOSIS — M79604 Pain in right leg: Secondary | ICD-10-CM | POA: Diagnosis not present

## 2021-04-08 DIAGNOSIS — M79605 Pain in left leg: Secondary | ICD-10-CM

## 2021-04-08 DIAGNOSIS — R7989 Other specified abnormal findings of blood chemistry: Secondary | ICD-10-CM

## 2021-04-08 NOTE — Patient Instructions (Addendum)
Antinuclear Antibody Test Why am I having this test? This is a test that is used to help diagnose systemic lupus erythematosus (SLE) and other autoimmune diseases. An autoimmune disease is a disease in which the body's own defense (immune)system attacks its organs. What is being tested? This test checks for antinuclear antibodies (ANA) in the blood. The presence of ANA is associated with several autoimmune diseases. It is seen in almost all patients with lupus. What kind of sample is taken? A blood sample is required for this test. It is usually collected by inserting a needle into a blood vessel.   How are the results reported? Your test results will be reported as either positive or negative. A false-positive result can occur. A false positive is incorrect because it means that a condition is present when it is not. What do the results mean? A positive test result may mean that you have:  Lupus.  Other autoimmune diseases, such as rheumatoid arthritis, scleroderma, or Sjgren syndrome. Conditions that may cause a false-positive result include:  Liver dysfunction.  Myasthenia gravis.  Infectious mononucleosis. Talk with your health care provider about what your results mean. Questions to ask your health care provider Ask your health care provider, or the department that is doing the test:  When will my results be ready?  How will I get my results?  What are my treatment options?  What other tests do I need?  What are my next steps? Summary  This is a test that is used to help diagnose systemic lupus erythematosus (SLE) and other autoimmune diseases. An autoimmune disease is a disease in which the body's own defense (immune)system attacks the body.  This test checks for antinuclear antibodies (ANA) in the blood. The presence of ANA is associated with several autoimmune diseases. It is seen in almost all patients with lupus.  Your test results will be reported as either  positive or negative. Talk with your health care provider about what your results mean.   Erythrocyte Sedimentation Rate Test Why am I having this test? The erythrocyte sedimentation rate (ESR) test is used to help find illnesses related to:  Sudden (acute) or long-term (chronic) infections.  Inflammation.  The body's disease-fighting system attacking healthy cells (autoimmune diseases).  Cancer.  Tissue death. If you have symptoms that may be related to any of these illnesses, your health care provider may do an ESR test before doing more specific tests. If you have an inflammatory immune disease, such as rheumatoid arthritis, you may have this test to help monitor your therapy. What is being tested? This test measures how long it takes for your red blood cells (erythrocytes) to settle in a solution over a certain amount of time (sedimentation rate). When you have an infection or inflammation, your red blood cells clump together and settle faster. The sedimentation rate provides information about how much inflammation is present in the body. What kind of sample is taken? A blood sample is required for this test. It is usually collected by inserting a needle into a blood vessel.   How do I prepare for this test? Follow any instructions from your health care provider about changing or stopping your regular medicines. Tell a health care provider about:  Any allergies you have.  All medicines you are taking, including vitamins, herbs, eye drops, creams, and over-the-counter medicines.  Any blood disorders you have.  Any surgeries you have had.  Any medical conditions you have, such as thyroid or kidney disease.  Whether you are pregnant or may be pregnant. How are the results reported? Your results will be reported as a value that measures sedimentation rate in millimeters per hour (mm/hr). Your health care provider will compare your results to normal ranges that were established  after testing a large group of people (reference values). Reference values may vary among labs and hospitals. For this test, common reference values, which vary by age and gender, are:  Newborn: 0-2 mm/hr.  Child, up to puberty: 0-10 mm/hr.  Female: ? Under 50 years: 0-20 mm/hr. ? 50-85 years: 0-30 mm/hr. ? Over 85 years: 0-42 mm/hr.  Female: ? Under 50 years: 0-15 mm/hr. ? 50-85 years: 0-20 mm/hr. ? Over 85 years: 0-30 mm/hr. Certain conditions or medicines may cause ESR levels to be falsely lower or higher, such as:  Pregnancy.  Obesity.  Steroids, birth control pills, and blood thinners.  Thyroid or kidney disease. What do the results mean? Results that are within reference values are considered normal, meaning that the level of inflammation in your body is healthy. High ESR levels mean that there is inflammation in your body. You will have more tests to help make a diagnosis. Inflammation may result from many different conditions or injuries. Talk with your health care provider about what your results mean. Questions to ask your health care provider Ask your health care provider, or the department that is doing the test:  When will my results be ready?  How will I get my results?  What are my treatment options?  What other tests do I need?  What are my next steps? Summary  The erythrocyte sedimentation rate (ESR) test is used to help find illnesses associated with sudden (acute) or long-term (chronic) infections, inflammation, autoimmune diseases, cancer, or tissue death.  If you have symptoms that may be related to any of these illnesses, your health care provider may do an ESR test before doing more specific tests. If you have an inflammatory immune disease, such as rheumatoid arthritis, you may have this test to help monitor your therapy.  This test measures how long it takes for your red blood cells (erythrocytes) to settle in a solution over a certain amount of  time (sedimentation rate). This provides information about how much inflammation is present in the body.    Anti-Smooth Muscle Antibody Test Why am I having this test? The anti-smooth muscle antibody (ASMA) test is performed in order to:  Help diagnose a type of liver damage that occurs when immune cells (antibodies) attack other parts of the body. This condition is called autoimmune chronic active hepatitis (CAH).  Investigate and rule out other causes of liver damage. Your health care provider may perform this test if you develop symptoms such as fatigue and yellowing of the skin and eyes (jaundice) along with abnormal findings on liver tests. What is being tested? The test measures the amount of ASMA in the blood. ASMA test is not specific for CAH, and it can be positive if you have a viral infection, cancer, multiple sclerosis (MS), or cirrhosis. What kind of sample is taken? A blood sample is required for this test. It is usually collected by inserting a needle into a blood vessel.   How are the results reported? Your test results will be reported as either positive or negative for ASMA. Negative means that there is not a significant amount of anti-smooth muscle antibodies in your blood. A positive result means that there is a significant amount of these antibodies  in your blood. What do the results mean? Increased levels of ASMA may indicate:  CAH.  Certain types of infectious hepatitis.  Multiple sclerosis.  Cancer.  Asthma. Talk with your health care provider about what your results mean. Questions to ask your health care provider Ask your health care provider, or the department that is doing the test:  When will my results be ready?  How will I get my results?  What are my treatment options?  What other tests do I need?  What are my next steps? Summary  The anti-smooth muscle antibody (ASMA) test is performed in order to help diagnose a condition called  autoimmune chronic active hepatitis (CAH).  The test measures the amount of ASMA in the blood.  Increased levels of ASMA in the blood may indicate that a patient has CAH.

## 2021-04-08 NOTE — Progress Notes (Signed)
Office Visit Note  Patient: Zoe Ramos             Date of Birth: Apr 20, 1976           MRN: [id number]             PCP: [facility] [place] Referring: Beverley Fiedler, * Visit Date: 04/08/2021  Subjective:  New Patient (Initial Visit) (Patient complains of pain in bilateral lower extremities. )   History of Present Illness: Zoe Ramos is a 45 y.o. female here for evaluation of positive ANA with chronic body pains.  She reports chronic bilateral leg pains and low back pain have been going on for about 4 years.  She describes symptoms as constant and progressive worsening over time.  She does not recall any acute onset does not recall any traumatic event or surgery associated with starting the symptoms.  She describes falling at least twice about 1 year and about 6 months ago.  She does not usually notice numbness or abnormal sensation just aching type pain diffusely through the legs.  She did not have any major medical changes except for renal cell carcinoma of the left kidney last year with subsequent nephrectomy.  She currently follows with pain management for multimodal treatment including neuropathy and opioid analgesic treatments.  Recent laboratory testing earlier this year also demonstrated elevated liver function tests and with positive hepatitis B virus core antibody.  Autoimmune antibodies showed positive SSA and a mildly positive smooth muscle antibodies.  Labs reviewed ANA positive SSA >8.0 DsDNA, RNP, [place], SSB neg HBV Core Ab pos HBV Surface Ab neg HAV Ab neg Alk phos 163 Actin Ab 23 HCV Ab neg  Activities of Daily Living:  Patient reports morning stiffness for 24 hours.   Patient Reports nocturnal pain.  Difficulty dressing/grooming: Reports Difficulty climbing stairs: Reports Difficulty getting out of chair: Reports Difficulty using hands for taps, buttons, cutlery, and/or writing: Denies  Review of Systems  Constitutional: Positive  for fatigue.  HENT: Negative for mouth sores, mouth dryness and nose dryness.   Eyes: Positive for dryness. Negative for pain, itching and visual disturbance.  Respiratory: Negative for cough, hemoptysis, shortness of breath and difficulty breathing.   Cardiovascular: Negative for chest pain, palpitations and swelling in legs/feet.  Gastrointestinal: Negative for abdominal pain, blood in stool, constipation and diarrhea.  Endocrine: Negative for increased urination.  Genitourinary: Negative for painful urination.  Musculoskeletal: Positive for arthralgias, joint pain, joint swelling, myalgias, muscle weakness, morning stiffness, muscle tenderness and myalgias.  Skin: Negative for color change, rash and redness.  Allergic/Immunologic: Negative for susceptible to infections.  Neurological: Positive for weakness. Negative for dizziness, numbness, headaches and memory loss.  Hematological: Negative for swollen glands.  Psychiatric/Behavioral: Positive for sleep disturbance. Negative for confusion.    PMFS History:  Patient Active Problem List   Diagnosis Date Noted  . Positive ANA (antinuclear antibody) 04/08/2021  . Bilateral leg pain 04/08/2021  . Abnormal liver function test 04/08/2021  . Acute hepatitis B 11/19/2020  . Renal cell cancer ([place]) 11/18/2020  . Transaminitis   . 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 Axillary lymphadenopathy 03/06/2020  . Goals of care, counseling/discussion 03/06/2020  . Left renal mass 02/16/2020  . Closed fracture of medial malleolus 10/07/2018  . Preterm delivery 09/29/2015  . Preterm premature rupture of membranes (PPROM) with onset of labor within 24 hours of rupture in third trimester, antepartum 09/28/2015  . Pregnancy 09/27/2015  .  Depressed bipolar affective disorder ([place]) 08/09/2015  . Schizo-affective schizophrenia, in remission ([place]) 08/09/2015  . Labor and delivery, indication for care 07/18/2015  . Pelvic pressure in  female 07/06/2015  . AMA (advanced maternal age) multigravida 35+ 05/17/2015  . H/O preterm delivery, currently pregnant 05/17/2015  . S/P Nissen fundoplication (with gastrostomy tube placement) ([place]) 11/15/2014  . Iron deficiency anemia 09/01/2014  . Hiatal hernia 09/01/2014  . Headache 09/01/2014  . GERD (gastroesophageal reflux disease) 09/01/2014  . Chest pain, atypical 09/01/2014  . Bipolar disorder (manic depression) ([place]) 09/01/2014  . Asthma 09/01/2014  . Dyspepsia 03/30/2014  . Anemia 03/30/2014    Past Medical History:  Diagnosis Date  . Anemia   . Cancer ([place])   . GERD (gastroesophageal reflux disease)   . History of hiatal hernia   . History of trichomonal vaginitis    07/2015  . Mental disorder    bipolar  . Nausea and vomiting during pregnancy     Family History  Problem Relation Age of Onset  . Cancer Mother        ovarian, breast  . Breast cancer Mother   . Diabetes Father   . Prostate cancer Father   . Kidney failure Father   . Diabetes Sister   . Kidney cancer Maternal Grandmother   . Pancreatic cancer Paternal Grandfather    Past Surgical History:  Procedure Laterality Date  . GALLBLADDER SURGERY    . HERNIA REPAIR     hiatal hernia and umbilical  . LAPAROSCOPIC NEPHRECTOMY, HAND ASSISTED Left 02/16/2020   Procedure: HAND ASSISTED LAPAROSCOPIC NEPHRECTOMY;  Surgeon: Billey Co, MD;  Location: [facility] ORS;  Service: Urology;  Laterality: Left;  . TUBAL LIGATION Bilateral 09/29/2015   Procedure: POST PARTUM TUBAL LIGATION;  Surgeon: Rubie Maid, MD;  Location: [facility] ORS;  Service: Gynecology;  Laterality: Bilateral;  . uterine ablasion     Social History   Social History Narrative  . Not on file   Immunization History  Administered Date(s) Administered  . Influenza,inj,Quad PF,6+ Mos 07/26/2015  . Influenza-Unspecified 07/26/2015, 07/31/2020  . PFIZER(Purple Top)SARS-COV-2 Vaccination 06/11/2020  . Tdap 08/09/2015     Objective: Vital  Signs: BP 104/74 (BP Location: Right Arm, Patient Position: Sitting, Cuff Size: Normal)   Pulse 80   Ht 5' 6.5" (1.689 m)   Wt 258 lb (117 kg)   BMI 41.02 kg/m    Physical Exam Constitutional:      Appearance: She is obese.  HENT:     Right Ear: External ear normal.     Left Ear: External ear normal.     Mouth/Throat:     Mouth: Mucous membranes are moist.     Pharynx: Oropharynx is clear.  Eyes:     Conjunctiva/sclera: Conjunctivae normal.  Cardiovascular:     Rate and Rhythm: Normal rate and regular rhythm.  Pulmonary:     Effort: Pulmonary effort is normal.     Breath sounds: Normal breath sounds.  Skin:    General: Skin is warm and dry.     Findings: No rash.  Neurological:     General: No focal deficit present.     Mental Status: She is alert.  Psychiatric:        Mood and Affect: Mood normal.     Musculoskeletal Exam:  Neck full ROM no tenderness Shoulders full ROM no tenderness or swelling Elbows full ROM no tenderness or swelling Wrists full ROM no tenderness or swelling Fingers full ROM no tenderness or  swelling Mild bilateral lumbar paraspinal muscle tenderness to palpation Hip normal internal and external rotation, low back pain provoked with straight leg raise bilaterally Knees full ROM generalized pain without swelling or focal tenderness Ankles full ROM no tenderness or swelling MTPs full ROM no tenderness or swelling    Investigation: No additional findings.  Imaging: No results found.  Recent Labs: Lab Results  Component Value Date   WBC 7.4 11/06/2020   HGB 10.9 (L) 11/06/2020   PLT 293 11/06/2020   NA 135 11/06/2020   K 4.1 11/06/2020   CL 104 11/06/2020   CO2 25 11/06/2020   GLUCOSE 94 11/06/2020   BUN 7 11/06/2020   CREATININE 0.87 11/06/2020   BILITOT 1.5 (H) 11/06/2020   ALKPHOS 499 (H) 11/06/2020   AST 636 (H) 11/06/2020   ALT 693 (H) 11/06/2020   PROT 7.2 11/06/2020   ALBUMIN 3.0 (L) 11/06/2020   CALCIUM 8.6 (L)  11/06/2020   GFRAA >60 06/06/2020    Speciality Comments: No specialty comments available.  Procedures:  No procedures performed Allergies: Morphine and related and Orange fruit [citrus]   Assessment / Plan:     Visit Diagnoses: Positive ANA (antinuclear antibody) - Plan: Sedimentation rate, C3 and C4, Anti-smooth muscle antibody, IgG  Positive ANA antibodies with positive SSA and actin labs otherwise indicate abnormal liver function tests besides this are pretty normal.  I do not see any evidence of active inflammatory disease such as lupus or Sjogren's syndrome from a physical exam today.  Autoimmune hepatitis can cause joint inflammation although visualized portion of lumbar spine and sacrum on abdominal MRI from last year showed no osteophyte formation.  We will check sedimentation rate and complements today we will also be checking smooth muscle antibody.  Bilateral leg pain - Plan: Sedimentation rate, C3 and C4, Anti-smooth muscle antibody, IgG  Bilateral leg pain fairly nonfocal on exam today she also had a considerable low back tenderness and pain with movement so there could be a significant component of radiation or referral.  Checking labs for systemic CTD as above.  Abnormal liver function test  Hepatomegaly elevated LFTs could be consistent with autoimmune disease but with recent positive hepatitis B viral DNA since January and acute liver injury this seems more likely as a current cause.  She has been seen with gastroenterology last office note reviewed from February.  Orders: Orders Placed This Encounter  Procedures  . Sedimentation rate  . C3 and C4  . Anti-smooth muscle antibody, IgG   No orders of the defined types were placed in this encounter.    Follow-Up Instructions: No follow-ups on file.   Collier Salina, MD  Note - This record has been created using [employer].  Chart creation errors have been sought, but may not always  have been located.  Such creation errors do not reflect on  the standard of medical care.

## 2021-04-10 ENCOUNTER — Telehealth: Payer: Self-pay | Admitting: Internal Medicine

## 2021-04-10 LAB — C3 AND C4
C3 Complement: 158 mg/dL (ref 83–193)
C4 Complement: 29 mg/dL (ref 15–57)

## 2021-04-10 LAB — ANTI-SMOOTH MUSCLE ANTIBODY, IGG: Actin (Smooth Muscle) Antibody (IGG): <20 U (ref <20)

## 2021-04-10 LAB — SEDIMENTATION RATE: Sed Rate: 25 mm/h — ABNORMAL HIGH (ref 0–20)

## 2021-04-10 NOTE — Telephone Encounter (Signed)
FYI: Patient is seeing lab results come over on My Chart, and was calling to go over results. Patient was advised doctor would call to review these results with her once all labs resulted, and he reviewed them himself.

## 2021-04-17 ENCOUNTER — Telehealth: Payer: Self-pay | Admitting: Internal Medicine

## 2021-04-17 NOTE — Telephone Encounter (Signed)
Patient going to PCP tomorrow, and was inquiring about lab results. Please call to advise.

## 2021-04-17 NOTE — Progress Notes (Signed)
Her lab results show normal sedimentation rate for age and sex, normal complements, and negative for smooth muscle antibodies. At this time I did not see any specific clinical changes for inflammatory disease so do not recommend starting immunosuppressing treatments for her positive ANA antibodies and she can follow up PRN.

## 2021-04-17 NOTE — Telephone Encounter (Signed)
FYI- I spoke with Zoe Ramos her lab results show normal sedimentation rate for age and sex, normal complements, and negative for smooth muscle antibodies. At this time I did not see any specific clinical changes for inflammatory disease so do not recommend starting immunosuppressing treatments for her positive ANA antibodies and she can follow up PRN.

## 2021-04-17 NOTE — Telephone Encounter (Signed)
Labs from 04/08/2021 resulted, please advise.

## 2021-05-02 ENCOUNTER — Telehealth: Payer: Self-pay | Admitting: Family Medicine

## 2021-05-02 NOTE — Telephone Encounter (Signed)
Pt. Has tubes tied, but  would like DEPO shot to help manage heavy/ hurtful cycles. If someone could call her to see if we can help or if she needs to schedule a different type of appointment / different provider (other than [facility]). Thanks

## 2021-05-07 NOTE — Telephone Encounter (Signed)
Phone call to pt. Left message on voicemail that RN with [facility] returning phone call. If you still need assistance or have questions for [facility], please call us back at [phone].

## 2021-05-07 NOTE — Telephone Encounter (Signed)
Phone call to pt. Left message on voicemail that RN with [facility] is returning phone call. Typically if someone has had their tubes tied and is interested in another form of BC for period issues, we would refer to a PCP or OBGYN.  If you have further questions or need further assistance, please call us back at [phone].

## 2021-08-06 ENCOUNTER — Inpatient Hospital Stay: Payer: Medicaid Other

## 2021-08-06 ENCOUNTER — Inpatient Hospital Stay: Payer: Medicaid Other | Admitting: Oncology

## 2021-08-28 IMAGING — CR DG KNEE COMPLETE 4+V*L*
4 series · 4 of 4 positions shown · non-contrast
Comparison: None.

CLINICAL DATA: Bilateral knee pain for 2 years, no known injury,
initial encounter

EXAM:
LEFT KNEE - COMPLETE 4+ VIEW

[knee ap (1 of 2)]
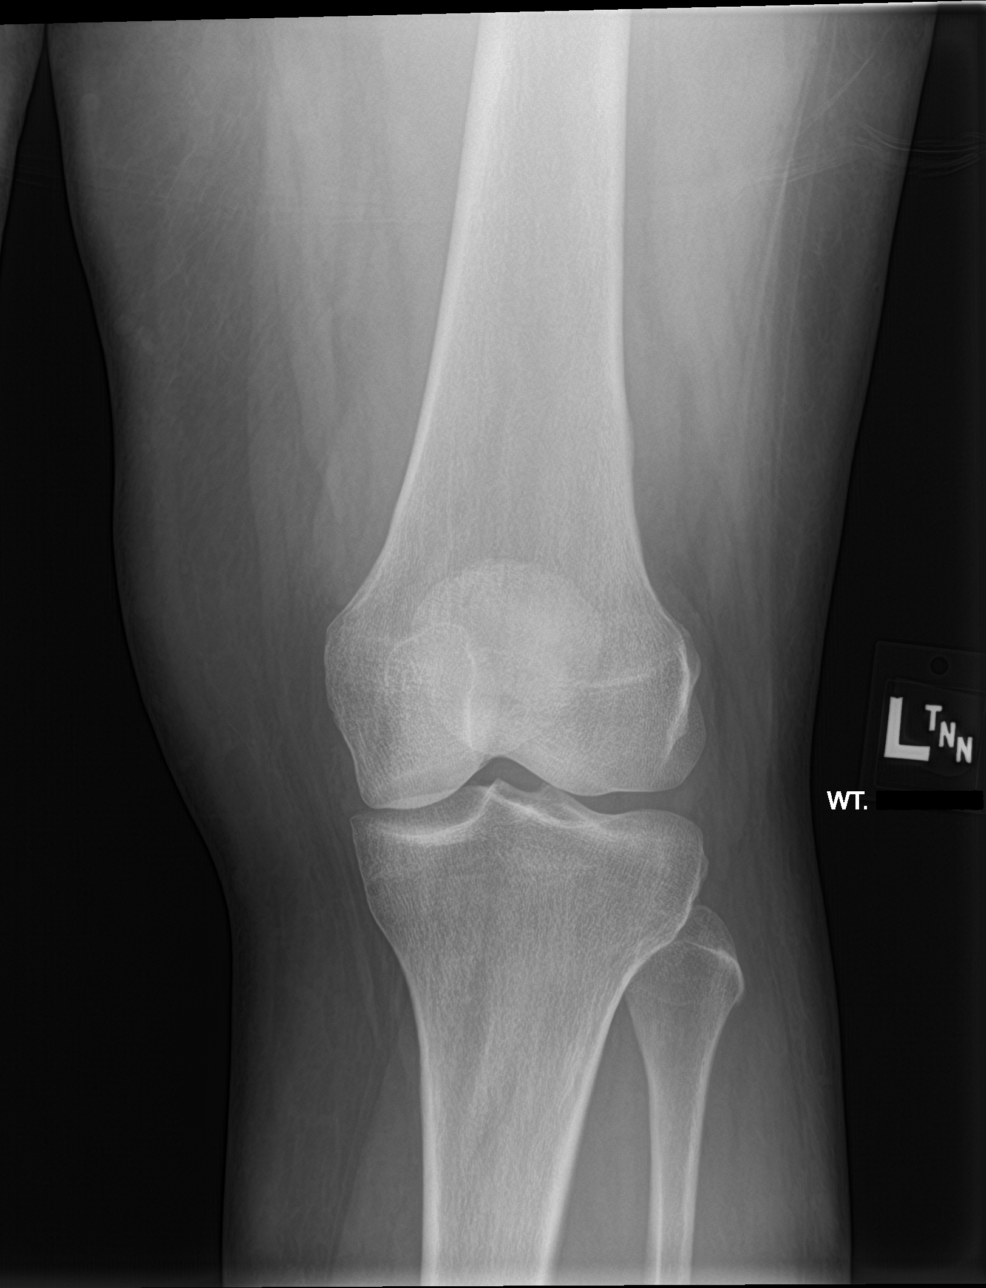

[knee lat]
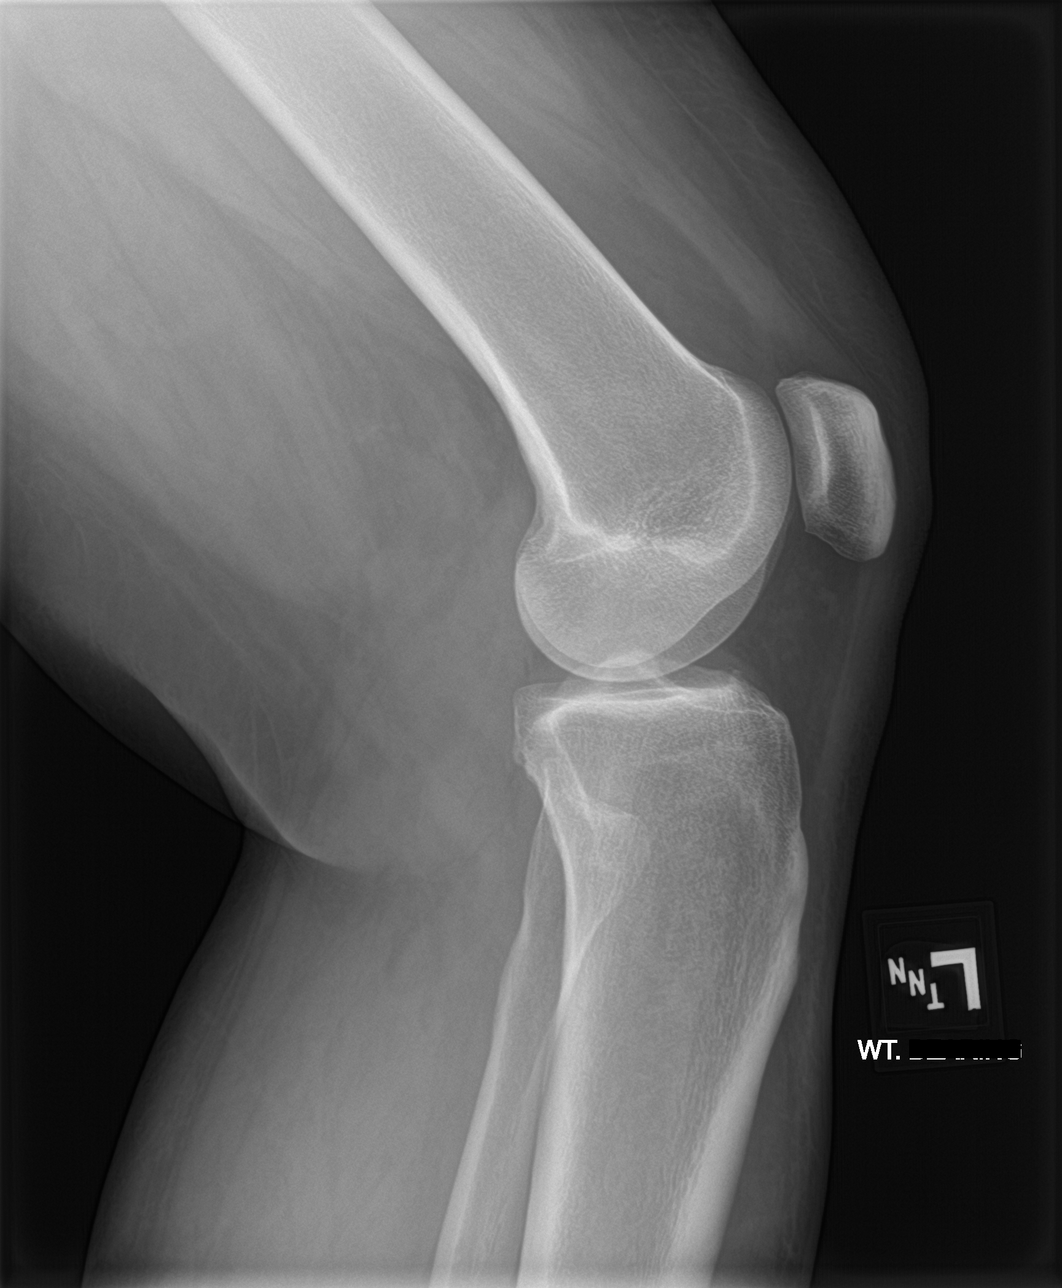

[sunrise]
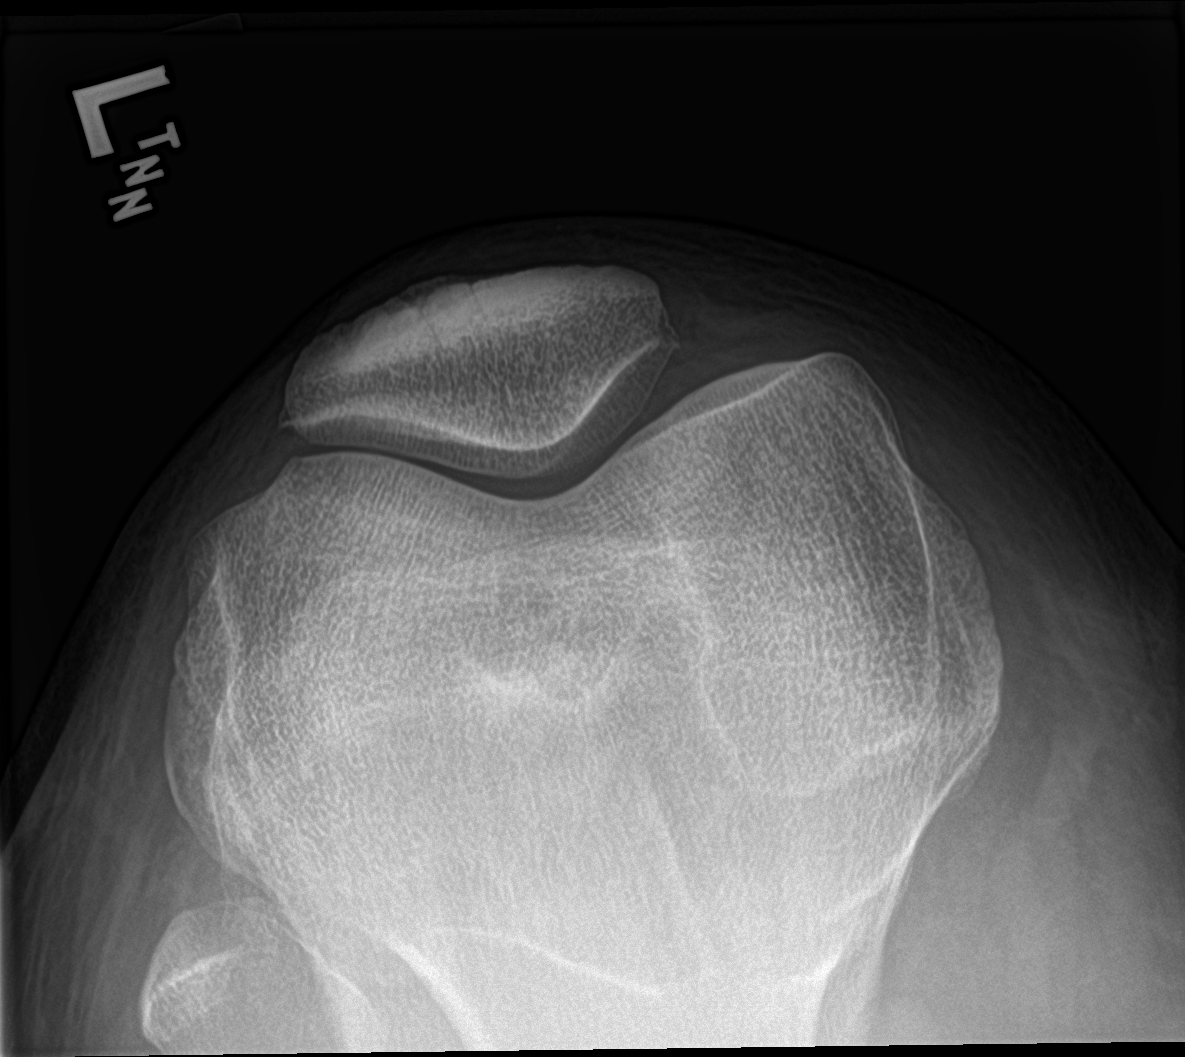

[knee ap (2 of 2)]
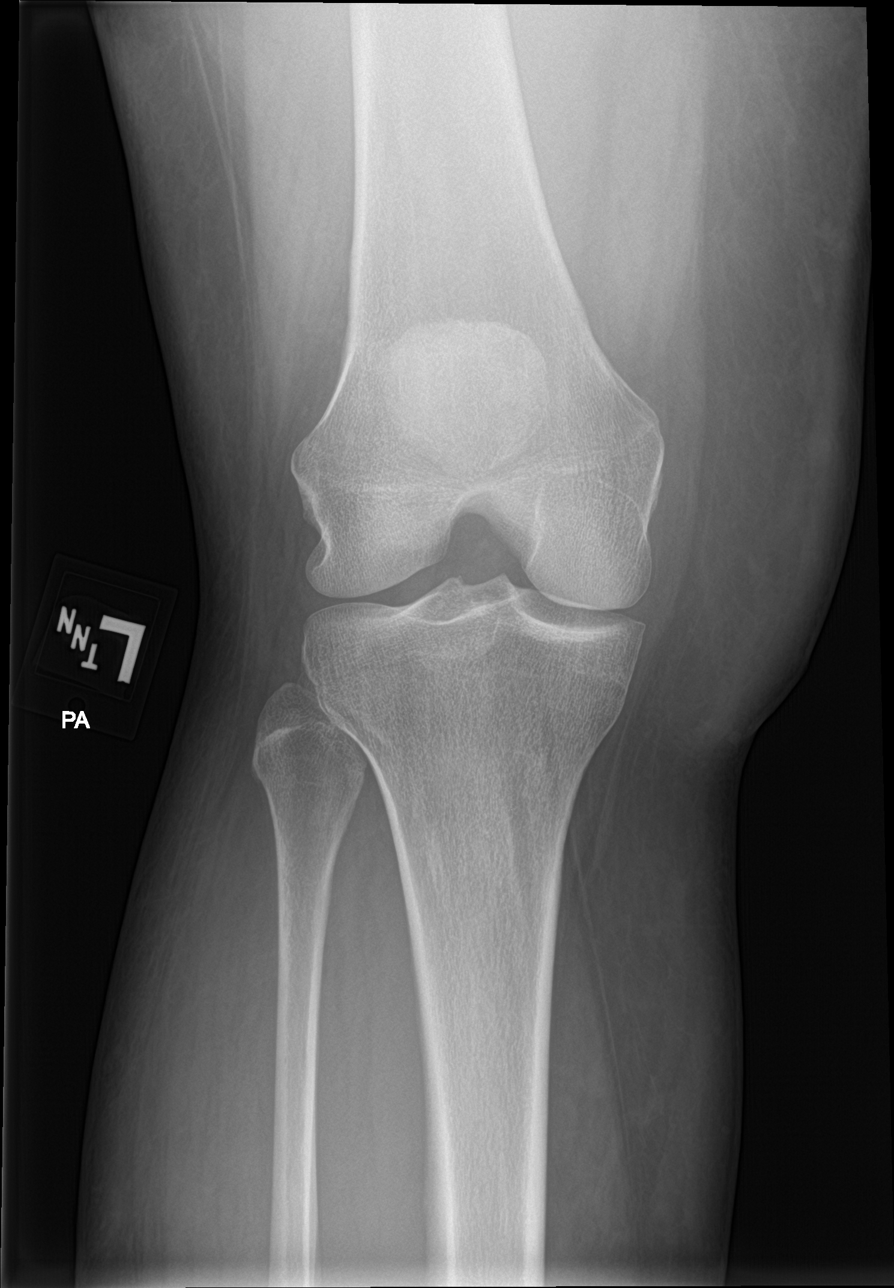

[4 of 4 positions shown; findings below may reference images not displayed]

FINDINGS: Mild patellofemoral degenerative changes are noted. No acute
fracture or dislocation is seen. No joint effusion is noted.
IMPRESSION: Mild degenerative change without acute abnormality.

## 2021-08-28 IMAGING — US US ABDOMEN COMPLETE
1 series · 14 of 25 positions shown · non-contrast
Comparison: CT abdomen and pelvis 12/06/2014.

CLINICAL DATA: Elevated alkaline phosphatase.

EXAM:
ABDOMEN ULTRASOUND COMPLETE

[Series 1: us abdomen complete · 14 of 96 slices shown]
[im 1/96]
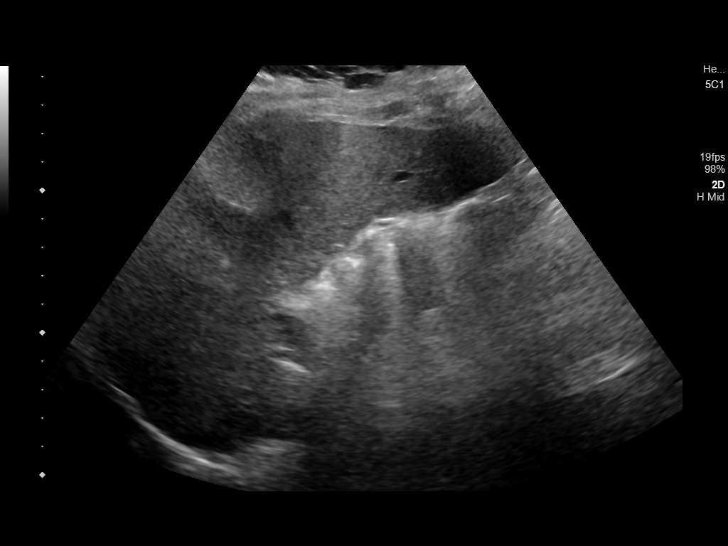
[im 8/96]
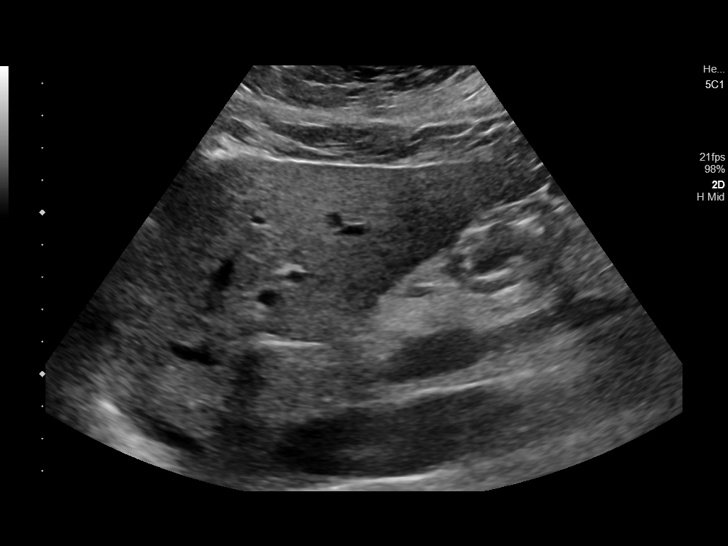
[im 16/96]
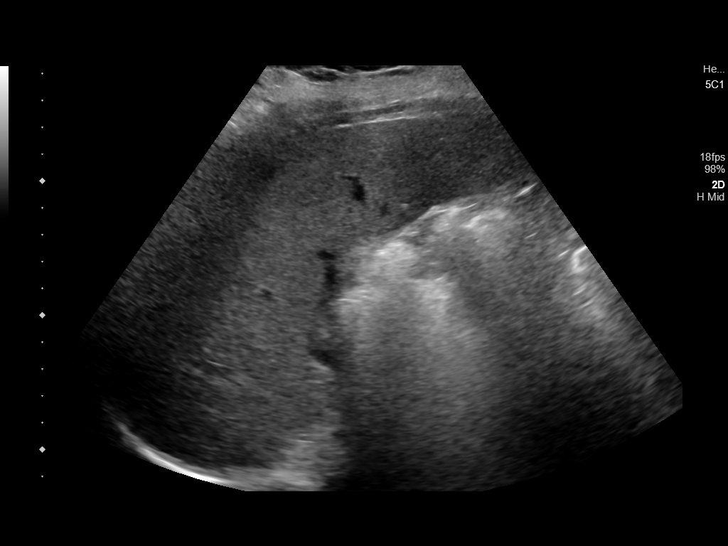
[im 24/96]
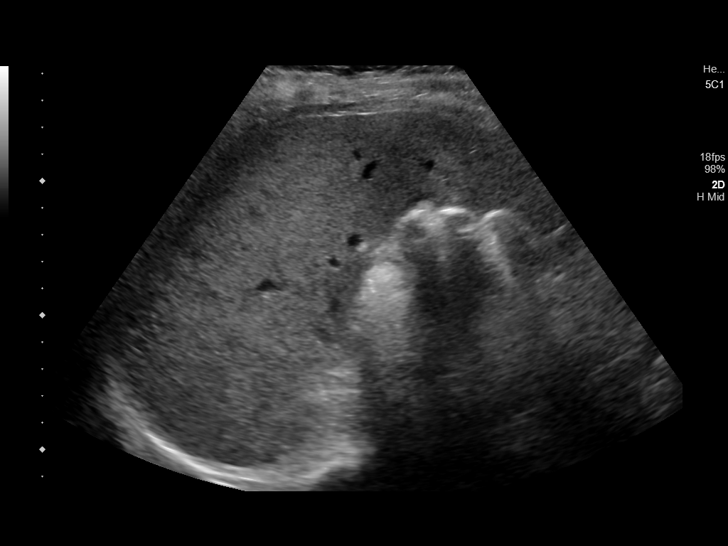
[im 32/96]
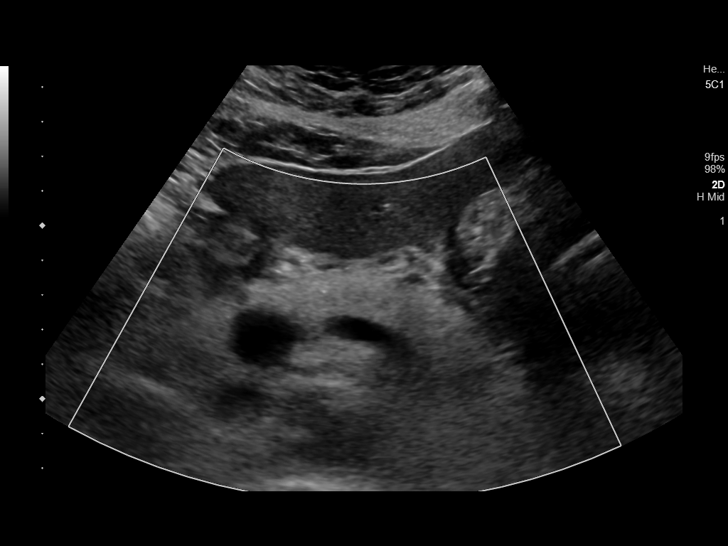
[im 36/96]
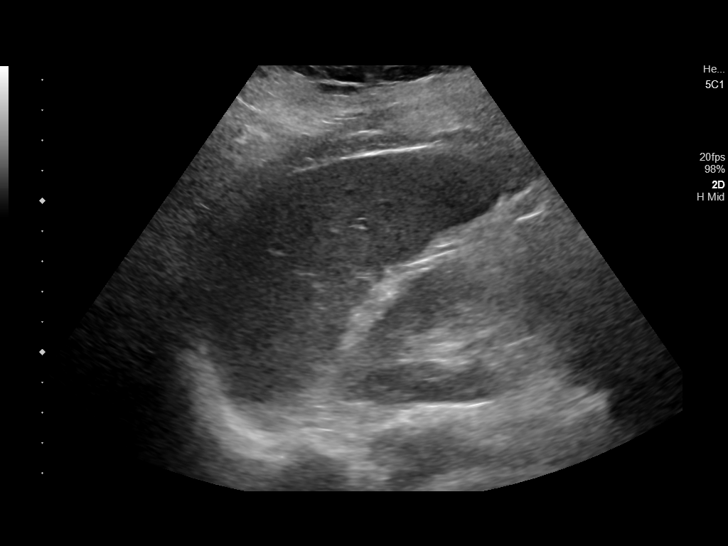
[im 44/96]
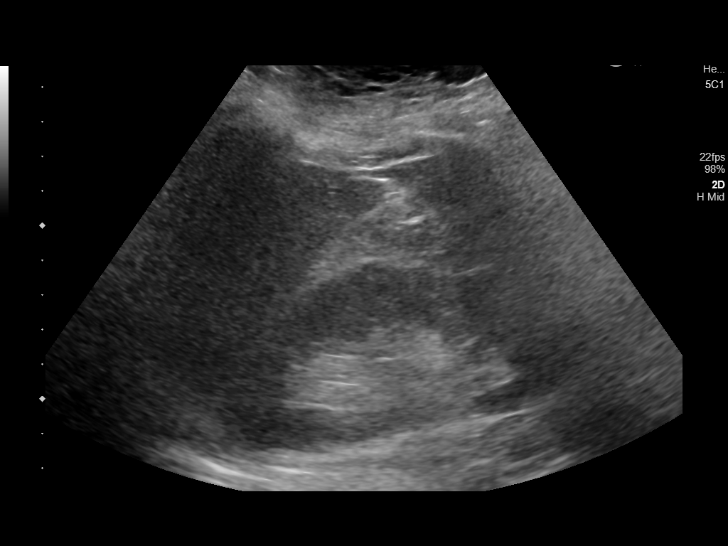
[im 52/96]
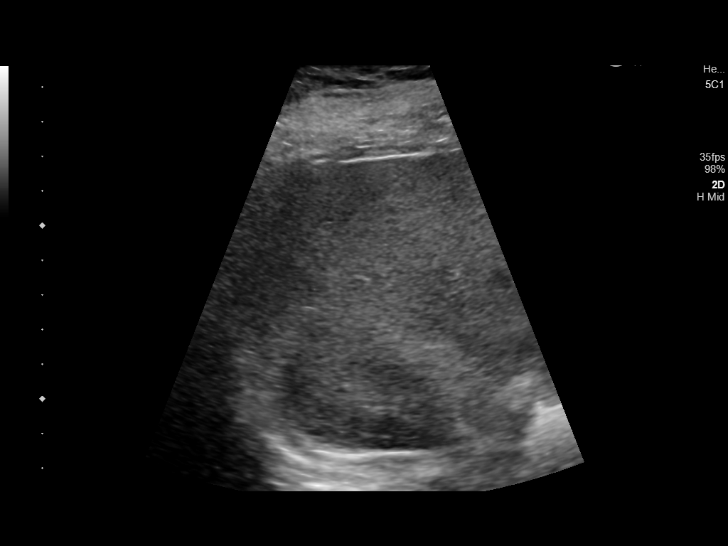
[im 60/96]
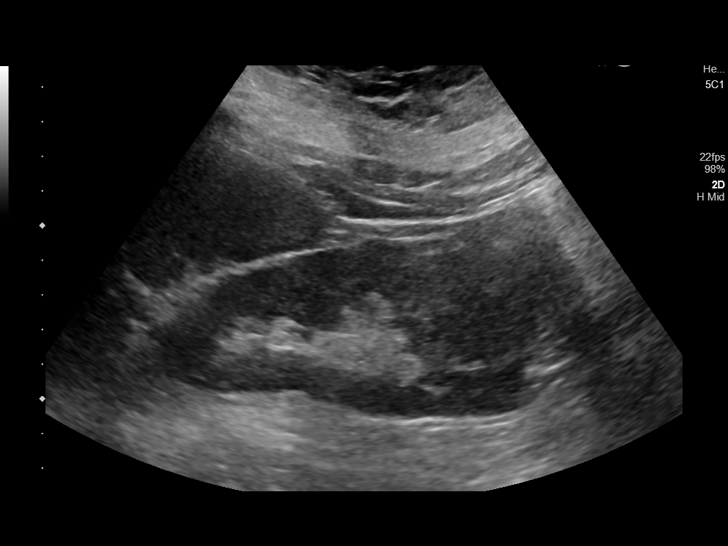
[im 64/96]
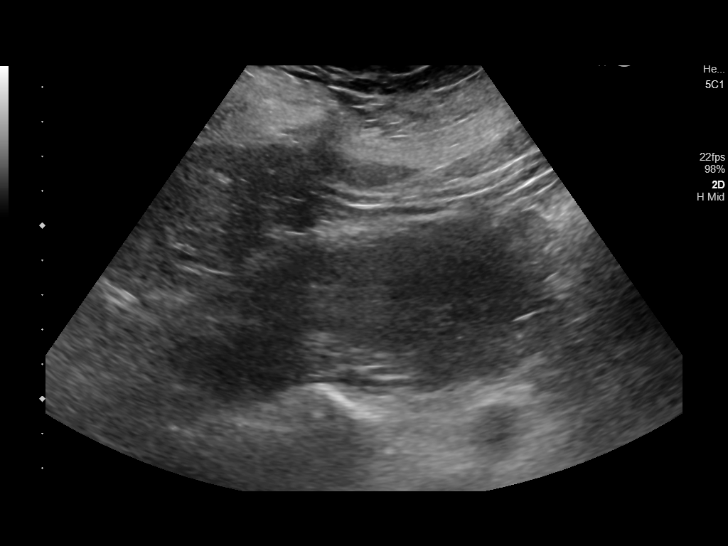
[im 72/96]
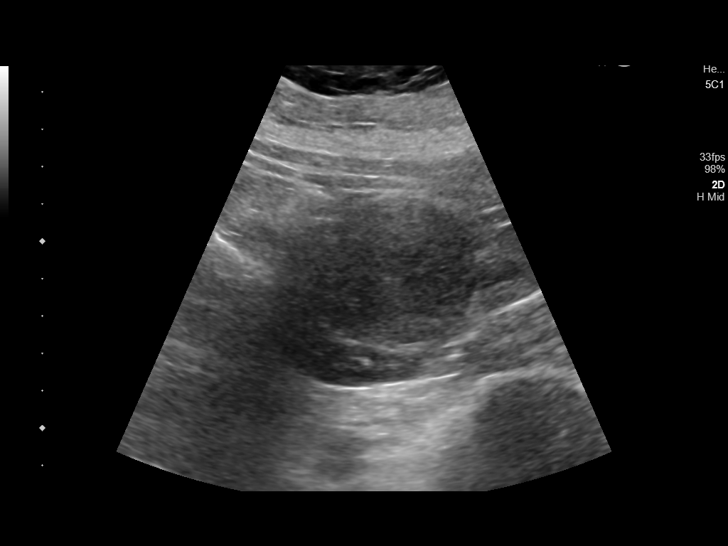
[im 80/96]
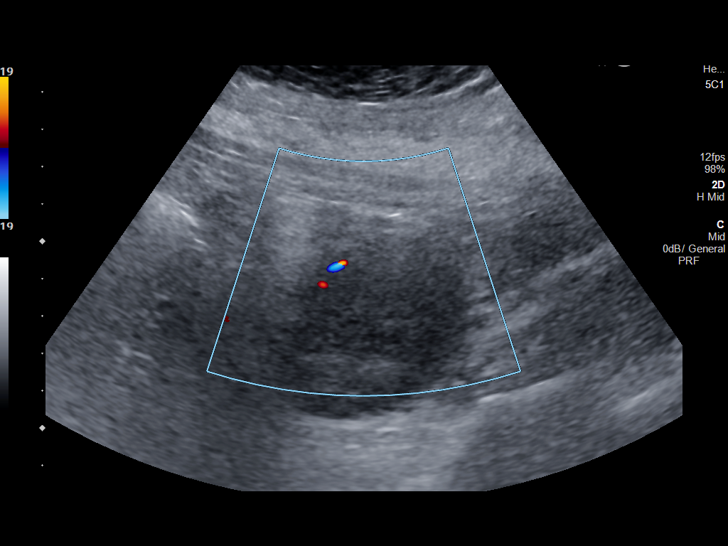
[im 88/96]
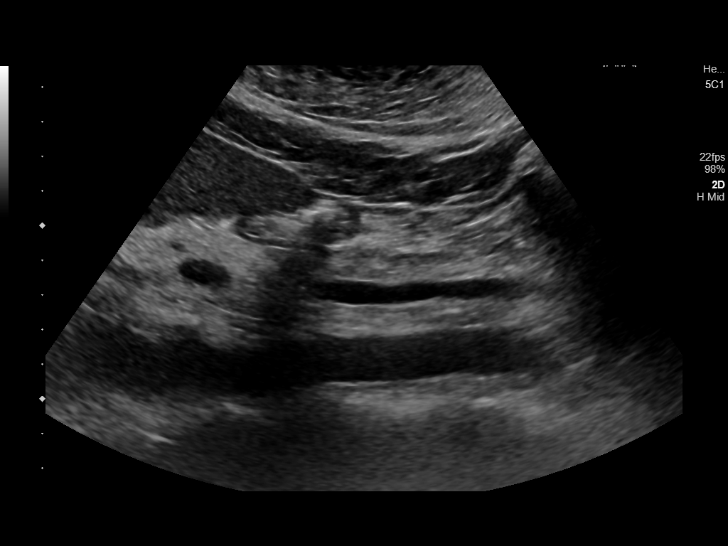
[im 96/96]
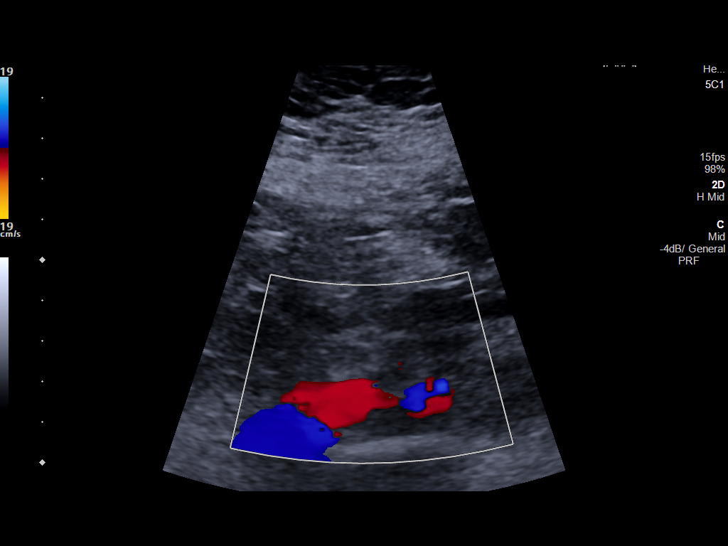

[14 of 25 positions shown; findings below may reference images not displayed]

FINDINGS: Gallbladder: Removed.

Common bile duct: Diameter: 0.3 cm

Liver: No focal lesion identified. Within normal limits in
parenchymal echogenicity. Portal vein is patent on color Doppler
imaging with normal direction of blood flow towards the liver.

IVC: No abnormality visualized.

Pancreas: Visualized portion unremarkable.

Spleen: Size and appearance within normal limits.

Right Kidney: Length: 10.2 cm. Echogenicity within normal limits. No
mass or hydronephrosis visualized.

Left Kidney: Length: 11.5 cm. There is a solid, round lesion in the
lower pole measuring 4.5 x 4.1 x 4.6 cm. No hydronephrosis.

Abdominal aorta: No aneurysm visualized.

Other findings: None.
IMPRESSION: Solid lesion lower pole left kidney is worrisome for renal cell
carcinoma. MRI of the abdomen with contrast is recommended for
further evaluation. The exam is otherwise negative.

These results will be called to the ordering clinician or
representative by the Radiologist Assistant, and communication
documented in the PACS or zVision Dashboard.

## 2023-02-24 ENCOUNTER — Emergency Department
Admission: EM | Admit: 2023-02-24 | Discharge: 2023-03-04 | Disposition: E | Payer: Medicaid Other | Attending: Emergency Medicine | Admitting: Emergency Medicine

## 2023-02-24 DIAGNOSIS — Z85528 Personal history of other malignant neoplasm of kidney: Secondary | ICD-10-CM | POA: Diagnosis not present

## 2023-02-24 DIAGNOSIS — I469 Cardiac arrest, cause unspecified: Secondary | ICD-10-CM | POA: Diagnosis not present

## 2023-03-04 NOTE — ED Notes (Signed)
Family at bedside. Chaplain at bedside.

## 2023-03-04 NOTE — Progress Notes (Signed)
Chaplain followed up per pass of from day time chaplain. Family at peace, and finishing saying goodbye. No needs expressed at this time. Family aware of how to reach chaplain services.

## 2023-03-04 NOTE — ED Notes (Signed)
Chaplain paged. RN ordered comfort tray for family.

## 2023-03-04 NOTE — ED Notes (Signed)
Condolence tray given to pt family.

## 2023-03-04 NOTE — ED Provider Notes (Addendum)
   [facility] Provider Note   Event Date/Time   First MD Initiated Contact with Patient 02/04/2023 1612     (approximate) History  No chief complaint on file.  HPI Zoe Ramos is a 47 y.o. female with a past medical history of schizoaffective disorder, GERD, clear to renal carcinoma who presents via EMS emergency traffic after a cardiac arrest.  Patient had bystander CPR and upon EMS arrival was found to be in ventricular fibrillation with multiple shocks administered resulting in asystole followed by PEA over the course of 45 minutes - 1 hour.  Per EMS prior to arrival, patient did not have any pupillary response or gag reflex.  Patient arrives on stretcher with i-gel in place ROS: Unable to assess   Physical Exam  Triage Vital Signs: ED Triage Vitals  Enc Vitals Group     BP      Pulse      Resp      Temp      Temp src      SpO2      Weight      Height      Head Circumference      Peak Flow      Pain Score      Pain Loc      Pain Edu?      Excl. in GC?    Most recent vital signs: There were no vitals filed for this visit. General: Unresponsive on stretcher CV:  No appreciable peripheral pulses Resp:  No use respirations Abd:  No distention.  Other:  Middle-aged morbidly obese African-American female unresponsive on stretcher.  Bedside ultrasound shows no appreciable cardiac activity ED Results / Procedures / Treatments   PROCEDURES: Critical Care performed: No Procedures MEDICATIONS ORDERED IN ED: Medications - No data to display IMPRESSION / MDM / ASSESSMENT AND PLAN / ED COURSE  I reviewed the triage vital signs and the nursing notes.                             Patient's presentation is most consistent with acute presentation with potential threat to life or bodily function. Patient is a 47 year old female who presents in PEA after multiple resuscitative efforts over 45 minutes to an hour including defibrillation, amiodarone and multiple  rounds of epinephrine.  Bedside ultrasound shows no cardiac activity.  Patient has no pupillary response, gag reflex, or appreciable pulses.  Time of death called at 1557.  Dispo: Expired   FINAL CLINICAL IMPRESSION(S) / ED DIAGNOSES   Final diagnoses:  Cardiac arrest   Rx / DC Orders   ED Discharge Orders     None      Note:  This document was prepared using Dragon voice recognition software and may include unintentional dictation errors.   Merwyn Katos, MD 02/28/2023 1643    Merwyn Katos, MD 02/27/2023 [phone]

## 2023-03-04 NOTE — ED Triage Notes (Signed)
Pt arrived via EMS from home. Pt was a witnessed collapse and bystanders began CPR. Upon EMS arrival patient was in vfib and received 5 shocks. EMS started CPR and continued for 1 hr, pt went into asystole and then PEA. Pt received 3 epi and  of amiodarone. MD Vicente Males communicated over radio with EMS.  On arrival patient did not have pulses or cardiac activity from cardiac ultrasound performed by MD Bradler. Pt has igel in place and an IO to left shin. Per MD Bradler TOD [phone].

## 2023-03-04 NOTE — Progress Notes (Signed)
   02/03/2023 1700  Spiritual Encounters  Type of Visit Initial  Care provided to: Pt and family  Conversation partners present during encounter Nurse  Referral source Nurse (RN/NT/LPN)  Reason for visit Patient death  OnCall Visit Yes   Chaplain responded to nurse page of patient death. Chaplain provided compassionate presence and reflective listening for daughter and cousin and niece. The Chaplain escorted young daughter to say goodbye to patient.Chaplain provided emotional/spiritual support as needed to many family members. Chaplain services are available for follow up as needed.

## 2023-03-04 DEATH — deceased
